# Patient Record
Sex: Female | Born: 1935 | Race: Black or African American | Hispanic: No | Marital: Single | Smoking: Never smoker
Health system: Southern US, Community
[De-identification: ages and names within clinical notes are randomized; demographics above are authoritative.]

## PROBLEM LIST (undated history)

## (undated) DIAGNOSIS — E785 Hyperlipidemia, unspecified: Secondary | ICD-10-CM

## (undated) DIAGNOSIS — I82409 Acute embolism and thrombosis of unspecified deep veins of unspecified lower extremity: Secondary | ICD-10-CM

## (undated) DIAGNOSIS — C189 Malignant neoplasm of colon, unspecified: Secondary | ICD-10-CM

## (undated) DIAGNOSIS — I10 Essential (primary) hypertension: Secondary | ICD-10-CM

## (undated) HISTORY — PX: APPENDECTOMY: SHX54

## (undated) HISTORY — DX: Acute embolism and thrombosis of unspecified deep veins of unspecified lower extremity: I82.409

## (undated) HISTORY — PX: COLON SURGERY: SHX602

## (undated) HISTORY — PX: ABDOMINAL HYSTERECTOMY: SHX81

---

## 1976-01-12 DIAGNOSIS — I82409 Acute embolism and thrombosis of unspecified deep veins of unspecified lower extremity: Secondary | ICD-10-CM

## 1976-01-12 HISTORY — DX: Acute embolism and thrombosis of unspecified deep veins of unspecified lower extremity: I82.409

## 2013-09-14 DIAGNOSIS — C189 Malignant neoplasm of colon, unspecified: Secondary | ICD-10-CM | POA: Insufficient documentation

## 2013-09-14 DIAGNOSIS — I10 Essential (primary) hypertension: Secondary | ICD-10-CM | POA: Insufficient documentation

## 2013-10-02 DIAGNOSIS — E785 Hyperlipidemia, unspecified: Secondary | ICD-10-CM | POA: Insufficient documentation

## 2013-11-28 ENCOUNTER — Emergency Department (INDEPENDENT_AMBULATORY_CARE_PROVIDER_SITE_OTHER)
Admission: EM | Admit: 2013-11-28 | Discharge: 2013-11-28 | Disposition: A | Discharge status: Home / Self Care | Payer: Medicare Other | Source: Home / Self Care | Attending: Family Medicine | Admitting: Family Medicine

## 2013-11-28 ENCOUNTER — Encounter: Payer: Self-pay | Admitting: *Deleted

## 2013-11-28 DIAGNOSIS — K0500 Acute gingivitis, plaque induced: Secondary | ICD-10-CM

## 2013-11-28 HISTORY — DX: Essential (primary) hypertension: I10

## 2013-11-28 HISTORY — DX: Malignant neoplasm of colon, unspecified: C18.9

## 2013-11-28 HISTORY — DX: Hyperlipidemia, unspecified: E78.5

## 2013-11-28 MED ORDER — PENICILLIN V POTASSIUM 500 MG PO TABS: 500.0000 mg | ORAL_TABLET | Freq: Four times a day (QID) | ORAL | Status: DC

## 2013-11-28 NOTE — ED Provider Notes (Signed)
CSN: [id number]     Arrival date & time 11/28/13  1042 History   First MD Initiated Contact with Patient 11/28/13 1148     Chief Complaint  Patient presents with  . Dental Pain     HPI Comments: Patient complains of soreness in her left upper gums for 3 days, and is concerned about a possible abscess.  She has had chills and possible low grade fever.  Patient is a 78 y.o. female presenting with tooth pain. The history is provided by the patient.  Dental Pain Location:  Upper Upper teeth location:  12/LU 1st bicuspid and 13/LU 2nd bicuspid Quality:  Aching and dull Severity:  Mild Onset quality:  Gradual Duration:  3 days Timing:  Constant Progression:  Worsening Chronicity:  New Context: abscess and poor dentition   Relieved by:  Nothing Worsened by:  Touching and pressure Ineffective treatments:  None tried Associated symptoms: fever and gum swelling   Associated symptoms: no congestion, no difficulty swallowing, no drooling, no facial pain, no facial swelling, no headaches, no neck pain, no neck swelling, no oral bleeding, no oral lesions and no trismus   Risk factors: periodontal disease     Past Medical History  Diagnosis Date  . Hypertension   . Hyperlipidemia   . Colon cancer    Past Surgical History  Procedure Laterality Date  . Abdominal hysterectomy    . Colon surgery    . Appendectomy     Family History  Problem Relation Age of Onset  . Uterine cancer Mother   . Stroke Father    History  Substance Use Topics  . Smoking status: Never Smoker   . Smokeless tobacco: Not on file  . Alcohol Use: Yes   OB History    No data available     Review of Systems  Constitutional: Positive for fever.  HENT: Negative for congestion, drooling, facial swelling and mouth sores.   Musculoskeletal: Negative for neck pain.  Neurological: Negative for headaches.  All other systems reviewed and are negative.   Allergies  Influenza vaccines  Home Medications    Prior to Admission medications   Medication Sig Start Date End Date Taking? Authorizing Provider  aspirin 81 MG tablet Take 81 mg by mouth daily.   Yes Historical Provider, MD  hydrochlorothiazide (HYDRODIURIL) 25 MG tablet Take 25 mg by mouth daily.   Yes Historical Provider, MD  lisinopril (PRINIVIL,ZESTRIL) 5 MG tablet Take 5 mg by mouth daily.   Yes Historical Provider, MD  penicillin v potassium (VEETID) 500 MG tablet Take 1 tablet (500 mg total) by mouth 4 (four) times daily. 11/28/13   Kandra Nicolas, MD   BP 151/90 mmHg  Pulse 103  Temp(Src) 100 F (37.8 C) (Oral)  Resp 16  Ht 5' 3.5" (1.613 m)  Wt 140 lb (63.504 kg)  BMI 24.41 kg/m2  SpO2 96% Physical Exam  Constitutional: She is oriented to person, place, and time. She appears well-developed and well-nourished. No distress.  HENT:  Head: Normocephalic and atraumatic.  Nose: Nose normal.  Mouth/Throat: No trismus in the jaw. Abnormal dentition. Dental caries present. No uvula swelling.    Left upper teeth as noted are tender to palpation.  Adjacent gingiva are tender and swollen but not fluctuant.   Eyes: Conjunctivae are normal. Pupils are equal, round, and reactive to light.  Neck: Neck supple.  Lymphadenopathy:    She has no cervical adenopathy.  Neurological: She is alert and oriented to person, place, and  time.  Skin: Skin is warm.  Nursing note and vitals reviewed.   ED Course  Procedures  none     MDM   1. Gingivitis, acute    Begin Penicillin VK 500mg  Q6hr. Continue warm salt water mouth rinse and warm pack to left cheek several times daily.  May take Tylenol for pain. Followup with dentist as soon as possible.    If symptoms become significantly worse during the night or over the weekend, proceed to the local emergency room.     Kandra Nicolas, MD 11/30/13 1440

## 2013-11-28 NOTE — Discharge Instructions (Signed)
Continue warm salt water mouth rinse and warm pack to left cheek several times daily.  May take Tylenol for pain.   If symptoms become significantly worse during the night or over the weekend, proceed to the local emergency room.    Gingivitis Gingivitis is a form of gum (periodontal) disease that causes redness, soreness, and swelling (inflammation) of your gums. CAUSES The most common cause of gingivitis is poor oral hygiene. A sticky substance made of bacteria, mucus, and food particles (plaque), is deposited on the exposed part of teeth. As plaque builds up, it reacts with the saliva in your mouth to form something called  tartar. Tartar is a hard deposit that becomes trapped around the base of the tooth. Plaque and tartar irritate the gums, leading to the formation of gingivitis. Other factors that increase your risk for gingivitis include:   Tobacco use.  Diabetes.  Older age.  Certain medications.  Certain viral or fungal infections.  Dry mouth.  Hormonal changes such as during pregnancy.  Poor nutrition.  Substance abuse.  Poor fitting dental restorations or appliances. SYMPTOMS You may notice inflammation of the soft tissue (gingiva) around the teeth. When these tissues become inflamed, they bleed easily, especially during flossing or brushing. The gums may also be:   Tender to the touch.  Bright red, purple red, or have a shiny appearance.  Swollen.  Wearing away from the teeth (receding), which exposes more of the tooth. Bad breath is often present. Continued infection around teeth can eventually cause cavities and loosen teeth. This may lead to eventual tooth loss. DIAGNOSIS A medical and dental history will be taken. Your mouth, teeth, and gums will be examined. Your dentist will look for soft, swollen purple-red, irritated gums. There may be deposits of plaque and tartar at the base of the teeth. Your gums will be looked at for the degree of redness, puffiness,  and bleeding tendencies. Your dentist will see if any of the teeth are loose. X-rays may be taken to see if the inflammation has spread to the supporting structures of the teeth. TREATMENT The goal is to reduce and reverse the inflammation. Proper treatment can usually reverse the symptoms of gingivitis and prevent further progression of the disease. Have your teeth cleaned. During the cleaning, all plaque and tartar will be removed. Instruction for proper home care will be given. You will need regular professional cleanings and check-ups in the future. HOME CARE INSTRUCTIONS  Brush your teeth twice a day and floss at least once per day. When flossing, it is best to floss first then brush.  Limit sugar between meals and maintain a well-balanced diet.  Even the best dental hygiene will not prevent plaque from developing. It is necessary for you to see your dentist on a regular basis for cleaning and regular checkups.  Your dentist can recommend proper oral hygiene and mouth care and suggest special toothpastes or mouth rinses.  Stop smoking. SEEK DENTAL OR MEDICAL CARE IF:  You have painful, reddened tissue around your teeth, or you have puffy swollen gums.  You have difficulty chewing.  You notice any loose or infected teeth.  You have swollen glands.  Your gums bleed easily when you brush your teeth or are very tender to the touch. Document Released: 06/23/2000 Document Revised: 03/22/2011 Document Reviewed: 04/03/2010 [facility] Patient Information 2015 [place], [place]. This information is not intended to replace advice given to you by your health care provider. Make sure you discuss any questions you have  with your health care provider. ° °

## 2013-11-28 NOTE — ED Notes (Signed)
Pt c/o abscess on her LT side upper gums x 3 days, with low grade fever. She does report having a URI currently. No OTC meds today.

## 2016-10-12 DIAGNOSIS — Z7982 Long term (current) use of aspirin: Secondary | ICD-10-CM | POA: Diagnosis not present

## 2016-10-12 DIAGNOSIS — N390 Urinary tract infection, site not specified: Secondary | ICD-10-CM | POA: Diagnosis not present

## 2016-10-12 DIAGNOSIS — R42 Dizziness and giddiness: Secondary | ICD-10-CM | POA: Diagnosis not present

## 2016-10-12 DIAGNOSIS — C189 Malignant neoplasm of colon, unspecified: Secondary | ICD-10-CM | POA: Diagnosis not present

## 2016-10-12 DIAGNOSIS — Z791 Long term (current) use of non-steroidal anti-inflammatories (NSAID): Secondary | ICD-10-CM | POA: Diagnosis not present

## 2016-10-12 DIAGNOSIS — N3000 Acute cystitis without hematuria: Secondary | ICD-10-CM | POA: Diagnosis not present

## 2016-10-12 DIAGNOSIS — K529 Noninfective gastroenteritis and colitis, unspecified: Secondary | ICD-10-CM | POA: Diagnosis not present

## 2016-10-12 DIAGNOSIS — I517 Cardiomegaly: Secondary | ICD-10-CM | POA: Diagnosis not present

## 2016-10-12 DIAGNOSIS — R197 Diarrhea, unspecified: Secondary | ICD-10-CM | POA: Diagnosis not present

## 2016-10-12 DIAGNOSIS — R112 Nausea with vomiting, unspecified: Secondary | ICD-10-CM | POA: Diagnosis not present

## 2016-10-12 DIAGNOSIS — Z9049 Acquired absence of other specified parts of digestive tract: Secondary | ICD-10-CM | POA: Diagnosis not present

## 2016-12-14 ENCOUNTER — Emergency Department (INDEPENDENT_AMBULATORY_CARE_PROVIDER_SITE_OTHER)
Admission: EM | Admit: 2016-12-14 | Discharge: 2016-12-14 | Disposition: A | Discharge status: Home / Self Care | Payer: Medicare Other | Source: Home / Self Care | Attending: Family Medicine | Admitting: Family Medicine

## 2016-12-14 ENCOUNTER — Other Ambulatory Visit: Payer: Self-pay

## 2016-12-14 DIAGNOSIS — I10 Essential (primary) hypertension: Secondary | ICD-10-CM | POA: Diagnosis not present

## 2016-12-14 LAB — POCT URINALYSIS DIP (MANUAL ENTRY)
BILIRUBIN UA: NEGATIVE mg/dL
Bilirubin, UA: NEGATIVE
GLUCOSE UA: NEGATIVE mg/dL
Leukocytes, UA: NEGATIVE
Nitrite, UA: NEGATIVE
Protein Ur, POC: NEGATIVE mg/dL
RBC UA: NEGATIVE
SPEC GRAV UA: 1.02 (ref 1.010–1.025)
Urobilinogen, UA: 0.2 E.U./dL
pH, UA: 7 (ref 5.0–8.0)

## 2016-12-14 MED ORDER — HYDROCHLOROTHIAZIDE 25 MG PO TABS: 25.0000 mg | ORAL_TABLET | Freq: Every day | ORAL | 1 refills | Status: DC

## 2016-12-14 NOTE — ED Triage Notes (Signed)
Pt was at the dentist today, and BP was high.  Here to have it checked.

## 2016-12-14 NOTE — ED Provider Notes (Signed)
[name]    CSN: [id number] Arrival date & time: 12/14/16  1841     History   Chief Complaint Chief Complaint  Patient presents with  . Hypertension    HPI Selena Mccarthy is a 81 y.o. female.   Patient underwent dental procedure today, and her BP was noted to be quite elevated.  Patient feels well and denies headache, chest pain, shortness of breath, and history of TIA symptoms.  She states that she has been treated in the past for hypertension with an excellent response from HCTZ.   The history is provided by the patient.    Past Medical History:  Diagnosis Date  . Colon cancer ([place])   . Hyperlipidemia   . Hypertension     There are no active problems to display for this patient.   Past Surgical History:  Procedure Laterality Date  . ABDOMINAL HYSTERECTOMY    . APPENDECTOMY    . COLON SURGERY      OB History    No data available       Home Medications    Prior to Admission medications   Medication Sig Start Date End Date Taking? Authorizing Provider  aspirin 81 MG tablet Take 81 mg by mouth daily.    [provider]  hydrochlorothiazide (HYDRODIURIL) 25 MG tablet Take 1 tablet (25 mg total) by mouth daily. 12/14/16   Kandra Nicolas, MD  lisinopril (PRINIVIL,ZESTRIL) 5 MG tablet Take 5 mg by mouth daily.    [provider]  penicillin v potassium (VEETID) 500 MG tablet Take 1 tablet (500 mg total) by mouth 4 (four) times daily. 11/28/13   Kandra Nicolas, MD    Family History Family History  Problem Relation Age of Onset  . Uterine cancer Mother   . Stroke Father     Social History Social History   Tobacco Use  . Smoking status: Never Smoker  Substance Use Topics  . Alcohol use: Yes  . Drug use: No     Allergies   Influenza vaccines   Review of Systems Review of Systems  Constitutional: Negative for activity change, appetite change, chills, diaphoresis, fatigue, fever and unexpected weight change.    HENT: Negative.   Eyes: Negative for photophobia and visual disturbance.  Respiratory: Negative for cough, chest tightness, shortness of breath, wheezing and stridor.   Cardiovascular: Negative for chest pain, palpitations and leg swelling.  Gastrointestinal: Negative.   Genitourinary: Negative.   Musculoskeletal: Negative.   Skin: Negative.   Neurological: Negative for dizziness, tremors, syncope, facial asymmetry, speech difficulty, weakness, light-headedness, numbness and headaches.     Physical Exam Triage Vital Signs ED Triage Vitals  Enc Vitals Group     BP 12/14/16 1901 (!) 218/125     Pulse Rate 12/14/16 1901 85     Resp --      Temp 12/14/16 1901 97.9 F (36.6 C)     Temp Source 12/14/16 1901 Oral     SpO2 12/14/16 1901 98 %     Weight 12/14/16 1902 128 lb (58.1 kg)     Height 12/14/16 1902 5\' 3"  (1.6 m)     Head Circumference --      Peak Flow --      Pain Score 12/14/16 1902 0     Pain Loc --      Pain Edu? --      Excl. in [place]? --    No data found.  Updated Vital Signs BP Marland Kitchen)  200/133 (BP Location: Left Arm)   Pulse 85   Temp 97.9 F (36.6 C) (Oral)   Ht 5\' 3"  (1.6 m)   Wt 128 lb (58.1 kg)   SpO2 98%   BMI 22.67 kg/m   Visual Acuity Right Eye Distance:   Left Eye Distance:   Bilateral Distance:    Right Eye Near:   Left Eye Near:    Bilateral Near:     Physical Exam Nursing notes and Vital Signs reviewed. Appearance:  Patient appears stated age, and in no acute distress.  She is alert and oriented.  Eyes:  Pupils are equal, round, and reactive to light and accomodation.  Extraocular movement is intact.  Conjunctivae are not inflamed.  Fundi benign Ears:  Canals normal.  Tympanic membranes normal.  Nose:   Normal turbinates.  No sinus tenderness.   Pharynx:  Normal Neck:  Supple.  No adenopathy or thyromegaly.  Carotids have normal upstrokes without bruits.  Lungs:  Clear to auscultation.  Breath sounds are equal.  Moving air well. Heart:   Regular rate and rhythm without murmurs, rubs, or gallops.  Abdomen:  Nontender without masses or hepatosplenomegaly.  Bowel sounds are present.  No CVA or flank tenderness.  Extremities:  No edema. Pedal pulses intact Skin:  No rash present.    UC Treatments / Results  Labs (all labs ordered are listed, but only abnormal results are displayed) Labs Reviewed  COMPLETE METABOLIC PANEL WITH GFR  POCT CBC W AUTO DIFF ([facility])  POCT URINALYSIS DIP (MANUAL ENTRY) negative    EKG  EKG Interpretation None       Radiology No results found.  Procedures Procedures (including critical care time)  Medications Ordered in UC Medications - No data to display   Initial Impression / Assessment and Plan / UC Course  I have reviewed the triage vital signs and the nursing notes.  Pertinent labs & imaging results that were available during my care of the patient were reviewed by me and considered in my medical decision making (see chart for details).    Resume HCTZ 25mg  daily. CMP, CBC pending. Minimize salt intake. Monitor blood pressure more frequently at different times of day and record on a calendar.  If symptoms become significantly worse during the night or over the weekend, proceed to the local emergency room.  Followup with Family Doctor in 3 days.    Final Clinical Impressions(s) / UC Diagnoses   Final diagnoses:  Essential hypertension    ED Discharge Orders        Ordered    hydrochlorothiazide (HYDRODIURIL) 25 MG tablet  Daily     12/14/16 1933          Kandra Nicolas, MD 12/15/16 2056

## 2016-12-14 NOTE — Discharge Instructions (Signed)
Minimize salt intake. Monitor blood pressure more frequently at different times of day and record on a calendar.  If symptoms become significantly worse during the night or over the weekend, proceed to the local emergency room.

## 2016-12-14 NOTE — ED Notes (Signed)
Attempted to draw blood from both antecubital areas without success. Pt will hydrate and have her blood draw at her appt with her PCP on Friday. Charna Archer, LPN

## 2016-12-17 ENCOUNTER — Emergency Department
Admission: EM | Admit: 2016-12-17 | Discharge: 2016-12-17 | Disposition: A | Discharge status: Home / Self Care | Payer: Medicare Other | Source: Home / Self Care

## 2016-12-17 ENCOUNTER — Telehealth: Payer: Self-pay | Admitting: *Deleted

## 2016-12-17 DIAGNOSIS — I10 Essential (primary) hypertension: Secondary | ICD-10-CM

## 2016-12-17 LAB — CBC WITH DIFFERENTIAL/PLATELET
Basophils Absolute: 21 cells/uL (ref 0–200)
Basophils Relative: 0.4 %
EOS PCT: 0.8 %
Eosinophils Absolute: 42 cells/uL (ref 15–500)
HCT: 41.5 % (ref 35.0–45.0)
HEMOGLOBIN: 14.6 g/dL (ref 11.7–15.5)
Lymphs Abs: 2070 cells/uL (ref 850–3900)
MCH: 30.6 pg (ref 27.0–33.0)
MCHC: 35.2 g/dL (ref 32.0–36.0)
MCV: 87 fL (ref 80.0–100.0)
MONOS PCT: 8.7 %
MPV: 10.1 fL (ref 7.5–12.5)
NEUTROS ABS: 2616 {cells}/uL (ref 1500–7800)
Neutrophils Relative %: 50.3 %
PLATELETS: 250 10*3/uL (ref 140–400)
RBC: 4.77 10*6/uL (ref 3.80–5.10)
RDW: 13.6 % (ref 11.0–15.0)
TOTAL LYMPHOCYTE: 39.8 %
WBC mixed population: 452 cells/uL (ref 200–950)
WBC: 5.2 10*3/uL (ref 3.8–10.8)

## 2016-12-17 LAB — COMPLETE METABOLIC PANEL WITH GFR
AG Ratio: 1.5 (calc) (ref 1.0–2.5)
ALT: 7 U/L (ref 6–29)
AST: 14 U/L (ref 10–35)
Albumin: 4.6 g/dL (ref 3.6–5.1)
Alkaline phosphatase (APISO): 76 U/L (ref 33–130)
BUN: 16 mg/dL (ref 7–25)
CHLORIDE: 101 mmol/L (ref 98–110)
CO2: 32 mmol/L (ref 20–32)
Calcium: 10.2 mg/dL (ref 8.6–10.4)
Creat: 0.8 mg/dL (ref 0.60–0.93)
GFR, Est African American: 81 mL/min/{1.73_m2} (ref >=60)
GFR, Est Non African American: 70 mL/min/{1.73_m2} (ref >=60)
Globulin: 3.1 g/dL (calc) (ref 1.9–3.7)
Glucose, Bld: 85 mg/dL (ref 65–99)
Potassium: 3.7 mmol/L (ref 3.5–5.3)
Sodium: 137 mmol/L (ref 135–146)
Total Bilirubin: 0.6 mg/dL (ref 0.2–1.2)
Total Protein: 7.7 g/dL (ref 6.1–8.1)

## 2016-12-17 NOTE — ED Triage Notes (Addendum)
Patient is here for BP check, orders for lab and a PCP appointment. Apt scheduled with [name] to see Dr. Sheppard Coil 12/22/16 for a 1:20 arrival. Orders for CBC and CMP placed per Dr. Assunta Found.  She is asymptomatic today. BP is still elevated. She has taken 3 doses of HCTZ.

## 2016-12-17 NOTE — Telephone Encounter (Signed)
Notified patient of lab results.  Has a follow up with PC next week.

## 2016-12-17 NOTE — ED Notes (Signed)
Patient will go downstairs to [facility] for a blood draw due to difficulty from previous visit.

## 2016-12-22 ENCOUNTER — Encounter: Payer: Self-pay | Admitting: Physician Assistant

## 2016-12-22 ENCOUNTER — Ambulatory Visit (INDEPENDENT_AMBULATORY_CARE_PROVIDER_SITE_OTHER): Payer: Medicare Other | Admitting: Physician Assistant

## 2016-12-22 VITALS — BP 165/96 | HR 84 | Wt 129.5 lb

## 2016-12-22 DIAGNOSIS — R011 Cardiac murmur, unspecified: Secondary | ICD-10-CM | POA: Diagnosis not present

## 2016-12-22 DIAGNOSIS — Z823 Family history of stroke: Secondary | ICD-10-CM

## 2016-12-22 DIAGNOSIS — Z86718 Personal history of other venous thrombosis and embolism: Secondary | ICD-10-CM

## 2016-12-22 DIAGNOSIS — Z85038 Personal history of other malignant neoplasm of large intestine: Secondary | ICD-10-CM

## 2016-12-22 DIAGNOSIS — Z7689 Persons encountering health services in other specified circumstances: Secondary | ICD-10-CM | POA: Diagnosis not present

## 2016-12-22 DIAGNOSIS — I10 Essential (primary) hypertension: Secondary | ICD-10-CM | POA: Diagnosis not present

## 2016-12-22 MED ORDER — LISINOPRIL 20 MG PO TABS: 20.0000 mg | ORAL_TABLET | Freq: Every day | ORAL | 3 refills | Status: DC

## 2016-12-22 NOTE — Progress Notes (Signed)
HPI:                                                                Selena Mccarthy is a 81 y.o. female who presents to [facility]: [facility] today to establish care  Current concerns include: blood pressure  HTN: taking Hydrochlorothiazide daily. Compliant with medications. Does not monitor BP at home. Denies vision change, headache, chest pain with exertion, orthopnea, lightheadedness, syncope and edema. Risk factors include: HLD, age>55   Past Medical History:  Diagnosis Date  . Colon cancer ([place])   . DVT, lower extremity ([place]) 1978   Left  . Hyperlipidemia   . Hypertension    Past Surgical History:  Procedure Laterality Date  . ABDOMINAL HYSTERECTOMY    . APPENDECTOMY    . COLON SURGERY     Social History   Tobacco Use  . Smoking status: Never Smoker  . Smokeless tobacco: Never Used  Substance Use Topics  . Alcohol use: Yes   family history includes Stroke in her father; Uterine cancer in her mother.  ROS: negative except as noted in the HPI  Medications: Current Outpatient Medications  Medication Sig Dispense Refill  . aspirin 81 MG tablet Take 81 mg by mouth daily.    . hydrochlorothiazide (HYDRODIURIL) 25 MG tablet Take 1 tablet (25 mg total) by mouth daily. 30 tablet 1  . lisinopril (PRINIVIL,ZESTRIL) 20 MG tablet Take 1 tablet (20 mg total) by mouth daily. 30 tablet 3   No current facility-administered medications for this visit.    Allergies  Allergen Reactions  . Influenza Vaccines     Objective:  BP (!) 165/96   Pulse 84   Wt 129 lb 8 oz (58.7 kg)   SpO2 98%   BMI 22.94 kg/m  Gen:  alert, not ill-appearing, no distress, appropriate for age 50: head normocephalic without obvious abnormality, conjunctiva and cornea clear, trachea midline Pulm: Normal work of breathing, normal phonation, clear to auscultation bilaterally, no wheezes, rales or rhonchi CV: Normal rate, regular rhythm, s1 and s2 distinct, grade  III/VI systolic murmur heard best at left lower sternal border, no clicks or rubs; no carotid bruit Neuro: alert and oriented x 3, no tremor MSK: extremities atraumatic, normal gait and station, trace peripheral edema Skin: intact, no rashes on exposed skin, no jaundice, no cyanosis Psych: well-groomed, cooperative, good eye contact, euthymic mood, affect mood-congruent, speech is articulate, and thought processes clear and goal-directed  Depression screen [facility] 2/9 12/22/2016  Decreased Interest 0  Down, Depressed, Hopeless 0  PHQ - 2 Score 0     No results found for this or any previous visit (from the past 72 hour(s)). No results found.    Assessment and Plan: 81 y.o. female with   1. Encounter to establish care - reviewed PMH, PSH, PFH, medications and allergies - reviewed health maintenance - requesting records from [place]   2. Uncontrolled stage 2 hypertension BP Readings from Last 3 Encounters:  12/22/16 (!) 165/96  12/17/16 (!) 189/123  12/14/16 (!) 200/133  - severe hypertension, asymptomatic. Recheck BP was slightly improved - adding ACE to thiazide - therapeutic lifestyle changes - patient unable to monitor BP at home - recheck nurse visit in 2 weeks -  normal CBC and CMP 5 days ago - lisinopril (PRINIVIL,ZESTRIL) 20 MG tablet; Take 1 tablet (20 mg total) by mouth daily.  Dispense: 30 tablet; Refill: 3  3. Systolic murmur - patient reports history of murmur, she is not sure if she has ever had an echocardiogram. Requesting records. If no prior echo, will order to assess for valvular disease  4. Family history of stroke or transient ischemic attack in father   3. History of colon cancer  Patient education and anticipatory guidance given Patient agrees with treatment plan Follow-up in 2 weeks for hypertension or sooner as needed if symptoms worsen or fail to improve  Darlyne Russian PA-C

## 2016-12-22 NOTE — Patient Instructions (Addendum)
For your blood pressure: - Continue your Hydrochlorothiazide every morning. Additionally take Lisinopril 1 tab ever morning with your other medicine - Limit salt to <1500 mg/day - Follow DASH eating plan - limit alcohol to 1 standard drink per day - avoid tobacco products - Follow-up in 2 weeks    Preventing Cerebrovascular Disease Arteries are blood vessels that carry blood that contains oxygen from the heart to all parts of the body. Cerebrovascular disease affects arteries that supply the brain. Any condition that blocks or disrupts blood flow to the brain can cause cerebrovascular disease. Brain cells that lose blood supply start to die within minutes (stroke). Stroke is the main danger of cerebrovascular disease. Atherosclerosis and high blood pressure are common causes of cerebrovascular disease. Atherosclerosis is narrowing and hardening of an artery that results when fat, cholesterol, calcium, or other substances (plaque) build up inside an artery. Plaque reduces blood flow through the artery. High blood pressure increases the risk of bleeding inside the brain. Making diet and lifestyle changes to prevent atherosclerosis and high blood pressure lowers your risk of cerebrovascular disease. What nutrition changes can be made?  Eat more fruits, vegetables, and whole grains.  Reduce how much saturated fat you eat. To do this, eat less red meat and fewer full-fat dairy products.  Eat healthy proteins instead of red meat. Healthy proteins include: ? Fish. Eat fish that contains heart-healthy omega-3 fatty acids, twice a week. Examples include salmon, albacore tuna, mackerel, and herring. ? Chicken. ? Nuts. ? Low-fat or nonfat yogurt.  Avoid processed meats, like bacon and lunchmeat.  Avoid foods that contain: ? A lot of sugar, such as sweets and drinks with added sugar. ? A lot of salt (sodium). Avoid adding extra salt to your food, as told by your health care provider. ? Trans fats,  such as margarine and baked goods. Trans fats may be listed as "partially hydrogenated oils" on food labels.  Check food labels to see how much sodium, sugar, and trans fats are in foods.  Use vegetable oils that contain low amounts of saturated fat, such as olive oil or canola oil. What lifestyle changes can be made?  Drink alcohol in moderation. This means no more than 1 drink a day for nonpregnant women and 2 drinks a day for men. One drink equals 12 oz of beer, 5 oz of wine, or 1 oz of hard liquor.  If you are overweight, ask your health care provider to recommend a weight-loss plan for you. Losing 5-10 lb (2.2-4.5 kg) can reduce your risk of diabetes, atherosclerosis, and high blood pressure.  Exercise for 30?60 minutes on most days, or as much as told by your health care provider. ? Do moderate-intensity exercise, such as brisk walking, bicycling, and water aerobics. Ask your health care provider which activities are safe for you.  Do not use any products that contain nicotine or tobacco, such as cigarettes and e-cigarettes. If you need help quitting, ask your health care provider. Why are these changes important? Making these changes lowers your risk of many diseases that can cause cerebrovascular disease and stroke. Stroke is a leading cause of death and disability. Making these changes also improves your overall health and quality of life. What can I do to lower my risk? The following factors make you more likely to develop cerebrovascular disease:  Being overweight.  Smoking.  Being physically inactive.  Eating a high-fat diet.  Having certain health conditions, such as: ? Diabetes. ? High blood pressure. ?  Heart disease. ? Atherosclerosis. ? High cholesterol. ? Sickle cell disease.  Talk with your health care provider about your risk for cerebrovascular disease. Work with your health care provider to control diseases that you have that may contribute to cerebrovascular  disease. Your health care provider may prescribe medicines to help prevent major causes of cerebrovascular disease. Where to find more information: Learn more about preventing cerebrovascular disease from:  [place], Lung, and [place]: MoAnalyst.de  Centers for Disease Control and Prevention: http://www.curry-wood.biz/  Summary  Cerebrovascular disease can lead to a stroke.  Atherosclerosis and high blood pressure are major causes of cerebrovascular disease.  Making diet and lifestyle changes can reduce your risk of cerebrovascular disease.  Work with your health care provider to get your risk factors under control to reduce your risk of cerebrovascular disease. This information is not intended to replace advice given to you by your health care provider. Make sure you discuss any questions you have with your health care provider. Document Released: 01/12/2015 Document Revised: 07/18/2015 Document Reviewed: 01/12/2015 Elsevier Interactive Patient Education  2018 [employer].

## 2017-01-06 ENCOUNTER — Encounter: Payer: Self-pay | Admitting: Physician Assistant

## 2017-01-06 ENCOUNTER — Ambulatory Visit (INDEPENDENT_AMBULATORY_CARE_PROVIDER_SITE_OTHER): Payer: Medicare Other | Admitting: Physician Assistant

## 2017-01-06 VITALS — BP 152/84 | HR 73 | Temp 98.6°F

## 2017-01-06 DIAGNOSIS — I10 Essential (primary) hypertension: Secondary | ICD-10-CM | POA: Insufficient documentation

## 2017-01-06 NOTE — Progress Notes (Signed)
Pt will come back in two weeks to have her blood pressure checked. Pt stated her BP was elevated this morning due to forgetting about her appointment and rushing to get here.

## 2017-01-07 ENCOUNTER — Telehealth: Payer: Self-pay

## 2017-01-07 NOTE — Telephone Encounter (Signed)
-----   Message from [place], [place] sent at 01/06/2017 10:56 AM EST ----- Please ask patient to monitor her BP at home with an automated cuff or to at least stop by local pharmacy a few times and log these readings and bring with her to her next nurse visit.

## 2017-01-07 NOTE — Telephone Encounter (Signed)
Clld pt - left message on vm to contact office re BP.

## 2017-01-20 ENCOUNTER — Ambulatory Visit: Payer: Medicare Other

## 2017-03-01 ENCOUNTER — Telehealth: Payer: Self-pay | Admitting: Physician Assistant

## 2017-03-01 ENCOUNTER — Other Ambulatory Visit: Payer: Self-pay

## 2017-03-01 MED ORDER — HYDROCHLOROTHIAZIDE 25 MG PO TABS: 25.0000 mg | ORAL_TABLET | Freq: Every day | ORAL | 0 refills | Status: DC

## 2017-03-01 NOTE — Telephone Encounter (Signed)
Refill sent -EH/RMA  

## 2017-03-01 NOTE — Telephone Encounter (Signed)
Pt is requesting a refill of hydrochlorothiazide be sent to the [facility]. Please advise. Thanks!

## 2017-05-04 ENCOUNTER — Ambulatory Visit (INDEPENDENT_AMBULATORY_CARE_PROVIDER_SITE_OTHER): Payer: Medicare Other | Admitting: Physician Assistant

## 2017-05-04 ENCOUNTER — Encounter: Payer: Self-pay | Admitting: Physician Assistant

## 2017-05-04 VITALS — BP 157/93 | HR 69 | Wt 130.0 lb

## 2017-05-04 DIAGNOSIS — Z23 Encounter for immunization: Secondary | ICD-10-CM | POA: Diagnosis not present

## 2017-05-04 DIAGNOSIS — M7701 Medial epicondylitis, right elbow: Secondary | ICD-10-CM

## 2017-05-04 DIAGNOSIS — I10 Essential (primary) hypertension: Secondary | ICD-10-CM

## 2017-05-04 MED ORDER — HYDROCHLOROTHIAZIDE 25 MG PO TABS: 25.0000 mg | ORAL_TABLET | Freq: Every day | ORAL | 1 refills | Status: DC

## 2017-05-04 MED ORDER — LISINOPRIL 20 MG PO TABS: 20.0000 mg | ORAL_TABLET | Freq: Every day | ORAL | 1 refills | Status: DC

## 2017-05-04 MED ORDER — LISINOPRIL 20 MG PO TABS: 20.0000 mg | ORAL_TABLET | Freq: Every day | ORAL | 3 refills | Status: DC

## 2017-05-04 MED ORDER — HYDROCHLOROTHIAZIDE 25 MG PO TABS: 25.0000 mg | ORAL_TABLET | Freq: Every day | ORAL | 0 refills | Status: DC

## 2017-05-04 NOTE — Progress Notes (Signed)
HPI:                                                                Selena Mccarthy is a 82 y.o. female who presents to [facility] today for hypertension follow-up   HTN: taking Lisinopril 20mg  and  Hydrochlorothiazide 25 mg daily. Compliant with medications. Monitor BP's at home, but can't remember her readings and does not have a log. She states they fluctuate. She is very physically active, gardening and taking care of granddaughter aged 48 years. Denies vision change, headache, chest pain with exertion, orthopnea, lightheadedness, syncope and edema. Risk factors include: HLD, age>65  Reports pain over the bony prominence of her right medial elbow for the last 3 months or so. Mainly notices it when she rests her elbow on an arm rest or bumps it. Denies joint swelling or limited range of motion. Reports she is right handed and does a lot of repetitive motions in her garden.   Depression screen [facility] 2/9 12/22/2016  Decreased Interest 0  Down, Depressed, Hopeless 0  PHQ - 2 Score 0    No flowsheet data found.    Past Medical History:  Diagnosis Date  . Colon cancer ([place])   . DVT, lower extremity ([place]) 1978   Left  . Hyperlipidemia   . Hypertension    Past Surgical History:  Procedure Laterality Date  . ABDOMINAL HYSTERECTOMY    . APPENDECTOMY    . COLON SURGERY     Social History   Tobacco Use  . Smoking status: Never Smoker  . Smokeless tobacco: Never Used  Substance Use Topics  . Alcohol use: Yes   family history includes Stroke in her father; Uterine cancer in her mother.    ROS: negative except as noted in the HPI  Medications: Current Outpatient Medications  Medication Sig Dispense Refill  . aspirin 81 MG tablet Take 81 mg by mouth daily.    . hydrochlorothiazide (HYDRODIURIL) 25 MG tablet Take 1 tablet (25 mg total) by mouth daily. Due for follow up visit 30 tablet 0  . lisinopril (PRINIVIL,ZESTRIL) 20 MG  tablet Take 1 tablet (20 mg total) by mouth daily. 30 tablet 3   No current facility-administered medications for this visit.    Allergies  Allergen Reactions  . Influenza Vaccines        Objective:  BP (!) 157/93   Pulse 69   Wt 130 lb (59 kg)   BMI 23.03 kg/m  Gen:  alert, not ill-appearing, no distress, appropriate for age 24: head normocephalic without obvious abnormality, conjunctiva and cornea clear, trachea midline Pulm: Normal work of breathing, normal phonation, clear to auscultation bilaterally, no wheezes, rales or rhonchi CV: Normal rate, regular rhythm, s1 and s2 distinct, no murmurs, clicks or rubs  Neuro: alert and oriented x 3, no tremor MSK: extremities atraumatic, normal gait and station, wearing left-sided knee high compression stocking, no peripheral edema on the right Right elbow: atraumatic, no edema or effusion, tenderness over the medial epicondyle, ROM intact, strength intact Skin: intact, no rashes on exposed skin, no jaundice, no cyanosis Psych: well-groomed, cooperative, good eye contact, euthymic mood, affect mood-congruent, speech is articulate, and thought processes clear and goal-directed  Lab Results  Component  Value Date   CREATININE 0.80 12/17/2016   BUN 16 12/17/2016   NA 137 12/17/2016   K 3.7 12/17/2016   CL 101 12/17/2016   CO2 32 12/17/2016     No results found for this or any previous visit (from the past 31 hour(s)). No results found.    Assessment and Plan: 82 y.o. female with   1. Essential hypertension BP Readings from Last 3 Encounters:  05/04/17 (!) 157/93  01/06/17 (!) 152/84  12/22/16 (!) 165/96  - nearly at goal on ACE/thiazide combo. Continue daily meds - renal function and lytes wnl - counseled on therapeutic lifestyle changes - cont to monitor BP at home - follow-up every 6 months   2. Medial epicondylitis of elbow, right - home rehab exercises - ice and NSAID prn  Essential hypertension - Plan:  lisinopril (PRINIVIL,ZESTRIL) 20 MG tablet, hydrochlorothiazide (HYDRODIURIL) 25 MG tablet, DISCONTINUED: hydrochlorothiazide (HYDRODIURIL) 25 MG tablet, DISCONTINUED: lisinopril (PRINIVIL,ZESTRIL) 20 MG tablet  Medial epicondylitis of elbow, right  Need for 23-polyvalent pneumococcal polysaccharide vaccine - Plan: Pneumococcal polysaccharide vaccine 23-valent greater than or equal to 2yo subcutaneous/IM   Patient education and anticipatory guidance given Patient agrees with treatment plan Follow-up in 3 months for Medicare Wellness or sooner as needed if symptoms worsen or fail to improve  Darlyne Russian PA-C

## 2017-05-04 NOTE — Patient Instructions (Addendum)
For your blood pressure: - Goal <140/90 - continue Lisinopril 20 mg and Hydrochlorothiazide 25 mg daily - monitor and log blood pressures at home - check around the same time each day in a relaxed setting - Limit salt to <2000 mg/day - Follow DASH eating plan - limit alcohol to 2 standard drinks per day for men and 1 per day for women - avoid tobacco products - weight loss: 7% of current body weight - follow-up every 6 months for your blood pressure     Managing Your Hypertension Hypertension is commonly called high blood pressure. This is when the force of your blood pressing against the walls of your arteries is too strong. Arteries are blood vessels that carry blood from your heart throughout your body. Hypertension forces the heart to work harder to pump blood, and may cause the arteries to become narrow or stiff. Having untreated or uncontrolled hypertension can cause heart attack, stroke, kidney disease, and other problems. What are blood pressure readings? A blood pressure reading consists of a higher number over a lower number. Ideally, your blood pressure should be below 120/80. The first ("top") number is called the systolic pressure. It is a measure of the pressure in your arteries as your heart beats. The second ("bottom") number is called the diastolic pressure. It is a measure of the pressure in your arteries as the heart relaxes. What does my blood pressure reading mean? Blood pressure is classified into four stages. Based on your blood pressure reading, your health care provider may use the following stages to determine what type of treatment you need, if any. Systolic pressure and diastolic pressure are measured in a unit called mm Hg. Normal  Systolic pressure: below 803.  Diastolic pressure: below 80. Elevated  Systolic pressure: 212-248.  Diastolic pressure: below 80. Hypertension stage 1  Systolic pressure: 250-037.  Diastolic pressure: 04-88. Hypertension  stage 2  Systolic pressure: 891 or above.  Diastolic pressure: 90 or above. What health risks are associated with hypertension? Managing your hypertension is an important responsibility. Uncontrolled hypertension can lead to:  A heart attack.  A stroke.  A weakened blood vessel (aneurysm).  Heart failure.  Kidney damage.  Eye damage.  Metabolic syndrome.  Memory and concentration problems.  What changes can I make to manage my hypertension? Hypertension can be managed by making lifestyle changes and possibly by taking medicines. Your health care provider will help you make a plan to bring your blood pressure within a normal range. Eating and drinking  Eat a diet that is high in fiber and potassium, and low in salt (sodium), added sugar, and fat. An example eating plan is called the DASH (Dietary Approaches to Stop Hypertension) diet. To eat this way: ? Eat plenty of fresh fruits and vegetables. Try to fill half of your plate at each meal with fruits and vegetables. ? Eat whole grains, such as whole wheat pasta, brown rice, or whole grain bread. Fill about one quarter of your plate with whole grains. ? Eat low-fat diary products. ? Avoid fatty cuts of meat, processed or cured meats, and poultry with skin. Fill about one quarter of your plate with lean proteins such as fish, chicken without skin, beans, eggs, and tofu. ? Avoid premade and processed foods. These tend to be higher in sodium, added sugar, and fat.  Reduce your daily sodium intake. Most people with hypertension should eat less than 1,500 mg of sodium a day.  Limit alcohol intake to no more  than 1 drink a day for nonpregnant women and 2 drinks a day for men. One drink equals 12 oz of beer, 5 oz of wine, or 1 oz of hard liquor. Lifestyle  Work with your health care provider to maintain a healthy body weight, or to lose weight. Ask what an ideal weight is for you.  Get at least 30 minutes of exercise that causes  your heart to beat faster (aerobic exercise) most days of the week. Activities may include walking, swimming, or biking.  Include exercise to strengthen your muscles (resistance exercise), such as weight lifting, as part of your weekly exercise routine. Try to do these types of exercises for 30 minutes at least 3 days a week.  Do not use any products that contain nicotine or tobacco, such as cigarettes and e-cigarettes. If you need help quitting, ask your health care provider.  Control any long-term (chronic) conditions you have, such as high cholesterol or diabetes. Monitoring  Monitor your blood pressure at home as told by your health care provider. Your personal target blood pressure may vary depending on your medical conditions, your age, and other factors.  Have your blood pressure checked regularly, as often as told by your health care provider. Working with your health care provider  Review all the medicines you take with your health care provider because there may be side effects or interactions.  Talk with your health care provider about your diet, exercise habits, and other lifestyle factors that may be contributing to hypertension.  Visit your health care provider regularly. Your health care provider can help you create and adjust your plan for managing hypertension. Will I need medicine to control my blood pressure? Your health care provider may prescribe medicine if lifestyle changes are not enough to get your blood pressure under control, and if:  Your systolic blood pressure is 130 or higher.  Your diastolic blood pressure is 80 or higher.  Take medicines only as told by your health care provider. Follow the directions carefully. Blood pressure medicines must be taken as prescribed. The medicine does not work as well when you skip doses. Skipping doses also puts you at risk for problems. Contact a health care provider if:  You think you are having a reaction to medicines  you have taken.  You have repeated (recurrent) headaches.  You feel dizzy.  You have swelling in your ankles.  You have trouble with your vision. Get help right away if:  You develop a severe headache or confusion.  You have unusual weakness or numbness, or you feel faint.  You have severe pain in your chest or abdomen.  You vomit repeatedly.  You have trouble breathing. Summary  Hypertension is when the force of blood pumping through your arteries is too strong. If this condition is not controlled, it may put you at risk for serious complications.  Your personal target blood pressure may vary depending on your medical conditions, your age, and other factors. For most people, a normal blood pressure is less than 120/80.  Hypertension is managed by lifestyle changes, medicines, or both. Lifestyle changes include weight loss, eating a healthy, low-sodium diet, exercising more, and limiting alcohol. This information is not intended to replace advice given to you by your health care provider. Make sure you discuss any questions you have with your health care provider. Document Released: 09/22/2011 Document Revised: 11/26/2015 Document Reviewed: 11/26/2015 Elsevier Interactive Patient Education  [employer].

## 2017-08-03 ENCOUNTER — Ambulatory Visit (INDEPENDENT_AMBULATORY_CARE_PROVIDER_SITE_OTHER): Payer: Medicare Other | Admitting: Physician Assistant

## 2017-08-03 ENCOUNTER — Encounter: Payer: Self-pay | Admitting: Physician Assistant

## 2017-08-03 VITALS — BP 198/108 | HR 76 | Wt 125.0 lb

## 2017-08-03 DIAGNOSIS — Z13220 Encounter for screening for lipoid disorders: Secondary | ICD-10-CM

## 2017-08-03 DIAGNOSIS — I10 Essential (primary) hypertension: Secondary | ICD-10-CM

## 2017-08-03 DIAGNOSIS — E876 Hypokalemia: Secondary | ICD-10-CM

## 2017-08-03 DIAGNOSIS — T502X5A Adverse effect of carbonic-anhydrase inhibitors, benzothiadiazides and other diuretics, initial encounter: Secondary | ICD-10-CM

## 2017-08-03 DIAGNOSIS — Z0000 Encounter for general adult medical examination without abnormal findings: Secondary | ICD-10-CM | POA: Diagnosis not present

## 2017-08-03 MED ORDER — AMLODIPINE BESYLATE 10 MG PO TABS: 10.0000 mg | ORAL_TABLET | Freq: Every day | ORAL | 3 refills | Status: DC

## 2017-08-03 NOTE — Progress Notes (Signed)
Subjective:   Selena Mccarthy is a 82 y.o. female who presents for Medicare Annual (Subsequent) preventive examination.  Review of Systems:  Review of Systems  HENT:       + R ear fullness  Eyes: Positive for blurred vision (wears corrective lenses).  Musculoskeletal: Positive for back pain (left-sided).  Skin: Positive for rash (insect bite, left knee).  All other systems reviewed and are negative.         Objective:     Vitals: BP (!) 198/108   Pulse 76   Wt 125 lb (56.7 kg)   BMI 22.14 kg/m   Body mass index is 22.14 kg/m. Gen:  alert, not ill-appearing, no distress, appropriate for age 67: head normocephalic without obvious abnormality, conjunctiva and cornea clear, trachea midline, no carotid bruit Pulm: Normal work of breathing, normal phonation, clear to auscultation bilaterally, no wheezes, rales or rhonchi CV: Normal rate, regular rhythm, s1 and s2 distinct, no murmurs, clicks or rubs  Neuro: alert and oriented x 3, no tremor MSK: extremities atraumatic, normal gait and station, no peripheral edema Skin: intact, no rashes on exposed skin, no jaundice, no cyanosis Psych: well-groomed, cooperative, good eye contact, euthymic mood, affect mood-congruent, speech is articulate, and thought processes clear and goal-directed  No flowsheet data found.  Tobacco Social History   Tobacco Use  Smoking Status Never Smoker  Smokeless Tobacco Never Used     Counseling given: Not Answered   Clinical Intake:                       Past Medical History:  Diagnosis Date  . Colon cancer ([place])   . DVT, lower extremity ([place]) 1978   Left  . Hyperlipidemia   . Hypertension    Past Surgical History:  Procedure Laterality Date  . ABDOMINAL HYSTERECTOMY    . APPENDECTOMY    . COLON SURGERY     Family History  Problem Relation Age of Onset  . Uterine cancer Mother   . Stroke Father    Social History   Socioeconomic History  . Marital status: Single     Spouse name: Not on file  . Number of children: Not on file  . Years of education: Not on file  . Highest education level: Not on file  Occupational History  . Not on file  Social Needs  . Financial resource strain: Not on file  . Food insecurity:    Worry: Not on file    Inability: Not on file  . Transportation needs:    Medical: Not on file    Non-medical: Not on file  Tobacco Use  . Smoking status: Never Smoker  . Smokeless tobacco: Never Used  Substance and Sexual Activity  . Alcohol use: Yes  . Drug use: No  . Sexual activity: Not on file  Lifestyle  . Physical activity:    Days per week: Not on file    Minutes per session: Not on file  . Stress: Not on file  Relationships  . Social connections:    Talks on phone: Not on file    Gets together: Not on file    Attends religious service: Not on file    Active member of club or organization: Not on file    Attends meetings of clubs or organizations: Not on file    Relationship status: Not on file  Other Topics Concern  . Not on file  Social History Narrative  . Not  on file    Outpatient Encounter Medications as of 08/03/2017  Medication Sig  . aspirin 81 MG tablet Take 81 mg by mouth daily.  . hydrochlorothiazide (HYDRODIURIL) 25 MG tablet Take 1 tablet (25 mg total) by mouth daily.  Marland Kitchen lisinopril (PRINIVIL,ZESTRIL) 20 MG tablet Take 1 tablet (20 mg total) by mouth daily.   No facility-administered encounter medications on file as of 08/03/2017.     Activities of Daily Living No flowsheet data found.  Patient Care Team: Kris Mouton as PCP - General (Physician Assistant)    Assessment:   This is a routine wellness examination for Selena Mccarthy.  Exercise Activities and Dietary recommendations    Goals    . Blood Pressure < 140/90    . DIET - REDUCE SALT INTAKE TO 2 GRAMS PER DAY OR LESS       Fall Risk Fall Risk  08/03/2017  Falls in the past year? No   Is the patient's home free  of loose throw rugs in walkways, pet beds, electrical cords, etc?   yes      Grab bars in the bathroom? no      Handrails on the stairs?   yes      Adequate lighting?   yes  Timed Get Up and Go performed: normal  Depression Screen PHQ 2/9 Scores 08/03/2017 12/22/2016  PHQ - 2 Score 0 0     Cognitive Function     6CIT Screen 08/03/2017  What Year? 0 points  What month? 0 points  What time? 0 points  Count back from 20 0 points  Months in reverse 0 points  Repeat phrase 6 points  Total Score 6    Immunization History  Administered Date(s) Administered  . Pneumococcal Conjugate-13 09/14/2013  . Pneumococcal Polysaccharide-23 05/04/2017    Qualifies for Shingles Vaccine? Yes, declines  Screening Tests Health Maintenance  Topic Date Due  . DEXA SCAN  08/04/2018 (Originally 11/18/2002)  . TETANUS/TDAP  08/04/2018 (Originally 11/17/1954)  . PNA vac Low Risk Adult  Completed    Cancer Screenings: Lung: Low Dose CT Chest recommended if Age 62-80 years, 30 pack-year currently smoking OR have quit w/in 15years. Patient does not qualify. Breast:  Up to date on Mammogram? No longer candidate for mammogram  Up to date of Bone Density/Dexa? No, declines Colorectal: no longer candidate for colon cancer screening      Plan:      Medicare annual wellness visit, subsequent  Uncontrolled stage 2 hypertension  BP Readings from Last 3 Encounters:  08/03/17 (!) 198/108  05/04/17 (!) 157/93  01/06/17 (!) 152/84  - BP extremely elevated on 2 automated and 1 manual check in office today. Patient is asymptomatic - no vision change, chest pain, headache/altered mentation - ED precautions discussed - CMP, Aldosterone, UA, and renal dopplers pending to assess for secondary hypertension - negative STOPBANG - adding Amlodipine 10 mg. Cont Lisinopril 20 mg and HCTZ 25 mg daily - close follow-up in 2 weeks  I have personally reviewed and noted the following in the patient's chart:    . Medical and social history . Use of alcohol, tobacco or illicit drugs  . Current medications and supplements . Functional ability and status . Nutritional status . Physical activity . Advanced directives . List of other physicians - n/a . Hospitalizations, surgeries, and ER visits in previous 12 months - n/a . Vitals . Screenings to include cognitive, depression, and falls  In addition, I have reviewed and discussed  with patient certain preventive protocols, quality metrics, and best practice recommendations. A written personalized care plan for preventive services as well as general preventive health recommendations were provided to patient.     Trixie Dredge, [place]  08/03/2017

## 2017-08-03 NOTE — Patient Instructions (Addendum)
Selena Mccarthy , Thank you for taking time to come for your Medicare Wellness Visit. I appreciate your ongoing commitment to your health goals. Please review the following plan we discussed and let me know if I can assist you in the future.   These are the goals we discussed: Goals    . Blood Pressure < 140/90    . DIET - REDUCE SALT INTAKE TO 2 GRAMS PER DAY OR LESS      Schedule your annual eye exam  This is a list of the screening recommended for you and due dates:  Health Maintenance  Topic Date Due  . DEXA scan (bone density measurement)  11/18/2002  . Tetanus Vaccine  08/04/2018*  . Pneumonia vaccines  Completed  *Topic was postponed. The date shown is not the original due date.    For your blood pressure: - Goal <140/90 - baby aspirin 81 mg daily to help prevent heart attack/stroke - monitor and log blood pressures at home - check around the same time each day in a relaxed setting - Limit salt to <2000 mg/day - Follow DASH eating plan - limit alcohol to 2 standard drinks per day for men and 1 per day for women - avoid tobacco products - weight loss: 7% of current body weight - follow-up every 6 months for your blood pressure    Managing Your Hypertension Hypertension is commonly called high blood pressure. This is when the force of your blood pressing against the walls of your arteries is too strong. Arteries are blood vessels that carry blood from your heart throughout your body. Hypertension forces the heart to work harder to pump blood, and may cause the arteries to become narrow or stiff. Having untreated or uncontrolled hypertension can cause heart attack, stroke, kidney disease, and other problems. What are blood pressure readings? A blood pressure reading consists of a higher number over a lower number. Ideally, your blood pressure should be below 120/80. The first ("top") number is called the systolic pressure. It is a measure of the pressure in your arteries as your  heart beats. The second ("bottom") number is called the diastolic pressure. It is a measure of the pressure in your arteries as the heart relaxes. What does my blood pressure reading mean? Blood pressure is classified into four stages. Based on your blood pressure reading, your health care provider may use the following stages to determine what type of treatment you need, if any. Systolic pressure and diastolic pressure are measured in a unit called mm Hg. Normal  Systolic pressure: below 824.  Diastolic pressure: below 80. Elevated  Systolic pressure: 235-361.  Diastolic pressure: below 80. Hypertension stage 1  Systolic pressure: 443-154.  Diastolic pressure: 00-86. Hypertension stage 2  Systolic pressure: 761 or above.  Diastolic pressure: 90 or above. What health risks are associated with hypertension? Managing your hypertension is an important responsibility. Uncontrolled hypertension can lead to:  A heart attack.  A stroke.  A weakened blood vessel (aneurysm).  Heart failure.  Kidney damage.  Eye damage.  Metabolic syndrome.  Memory and concentration problems.  What changes can I make to manage my hypertension? Hypertension can be managed by making lifestyle changes and possibly by taking medicines. Your health care provider will help you make a plan to bring your blood pressure within a normal range. Eating and drinking  Eat a diet that is high in fiber and potassium, and low in salt (sodium), added sugar, and fat. An example eating plan  is called the DASH (Dietary Approaches to Stop Hypertension) diet. To eat this way: ? Eat plenty of fresh fruits and vegetables. Try to fill half of your plate at each meal with fruits and vegetables. ? Eat whole grains, such as whole wheat pasta, brown rice, or whole grain bread. Fill about one quarter of your plate with whole grains. ? Eat low-fat diary products. ? Avoid fatty cuts of meat, processed or cured meats, and  poultry with skin. Fill about one quarter of your plate with lean proteins such as fish, chicken without skin, beans, eggs, and tofu. ? Avoid premade and processed foods. These tend to be higher in sodium, added sugar, and fat.  Reduce your daily sodium intake. Most people with hypertension should eat less than 1,500 mg of sodium a day.  Limit alcohol intake to no more than 1 drink a day for nonpregnant women and 2 drinks a day for men. One drink equals 12 oz of beer, 5 oz of wine, or 1 oz of hard liquor. Lifestyle  Work with your health care provider to maintain a healthy body weight, or to lose weight. Ask what an ideal weight is for you.  Get at least 30 minutes of exercise that causes your heart to beat faster (aerobic exercise) most days of the week. Activities may include walking, swimming, or biking.  Include exercise to strengthen your muscles (resistance exercise), such as weight lifting, as part of your weekly exercise routine. Try to do these types of exercises for 30 minutes at least 3 days a week.  Do not use any products that contain nicotine or tobacco, such as cigarettes and e-cigarettes. If you need help quitting, ask your health care provider.  Control any long-term (chronic) conditions you have, such as high cholesterol or diabetes. Monitoring  Monitor your blood pressure at home as told by your health care provider. Your personal target blood pressure may vary depending on your medical conditions, your age, and other factors.  Have your blood pressure checked regularly, as often as told by your health care provider. Working with your health care provider  Review all the medicines you take with your health care provider because there may be side effects or interactions.  Talk with your health care provider about your diet, exercise habits, and other lifestyle factors that may be contributing to hypertension.  Visit your health care provider regularly. Your health care  provider can help you create and adjust your plan for managing hypertension. Will I need medicine to control my blood pressure? Your health care provider may prescribe medicine if lifestyle changes are not enough to get your blood pressure under control, and if:  Your systolic blood pressure is 130 or higher.  Your diastolic blood pressure is 80 or higher.  Take medicines only as told by your health care provider. Follow the directions carefully. Blood pressure medicines must be taken as prescribed. The medicine does not work as well when you skip doses. Skipping doses also puts you at risk for problems. Contact a health care provider if:  You think you are having a reaction to medicines you have taken.  You have repeated (recurrent) headaches.  You feel dizzy.  You have swelling in your ankles.  You have trouble with your vision. Get help right away if:  You develop a severe headache or confusion.  You have unusual weakness or numbness, or you feel faint.  You have severe pain in your chest or abdomen.  You vomit repeatedly.  You have trouble breathing. Summary  Hypertension is when the force of blood pumping through your arteries is too strong. If this condition is not controlled, it may put you at risk for serious complications.  Your personal target blood pressure may vary depending on your medical conditions, your age, and other factors. For most people, a normal blood pressure is less than 120/80.  Hypertension is managed by lifestyle changes, medicines, or both. Lifestyle changes include weight loss, eating a healthy, low-sodium diet, exercising more, and limiting alcohol. This information is not intended to replace advice given to you by your health care provider. Make sure you discuss any questions you have with your health care provider. Document Released: 09/22/2011 Document Revised: 11/26/2015 Document Reviewed: 11/26/2015 Elsevier Interactive Patient Education   [employer].

## 2017-08-05 MED ORDER — POTASSIUM CHLORIDE CRYS ER 20 MEQ PO TBCR: EXTENDED_RELEASE_TABLET | ORAL | 0 refills | Status: DC

## 2017-08-05 NOTE — Progress Notes (Signed)
Potassium is a little low This is likely due to hydrochlorothiazde This medication is medically necessary for controlling her blood pressure Would like her to start prescription potassium supplement 20 meq twice a day for 2 days, then 20 meq daily Recheck potassium in 1 week

## 2017-08-05 NOTE — Addendum Note (Signed)
Addended by: Nelson Chimes E on: 08/05/2017 03:47 PM   Modules accepted: Orders

## 2017-08-09 LAB — COMPLETE METABOLIC PANEL WITH GFR
AG Ratio: 1.4 (calc) (ref 1.0–2.5)
ALBUMIN MSPROF: 4.9 g/dL (ref 3.6–5.1)
ALT: 11 U/L (ref 6–29)
AST: 18 U/L (ref 10–35)
Alkaline phosphatase (APISO): 74 U/L (ref 33–130)
BILIRUBIN TOTAL: 0.6 mg/dL (ref 0.2–1.2)
BUN: 13 mg/dL (ref 7–25)
CO2: 30 mmol/L (ref 20–32)
Calcium: 10.4 mg/dL (ref 8.6–10.4)
Chloride: 102 mmol/L (ref 98–110)
Creat: 0.74 mg/dL (ref 0.60–0.93)
GFR, Est African American: 89 mL/min/{1.73_m2} (ref >=60)
GFR, Est Non African American: 77 mL/min/{1.73_m2} (ref >=60)
GLOBULIN: 3.5 g/dL (ref 1.9–3.7)
Glucose, Bld: 87 mg/dL (ref 65–139)
Potassium: 3.3 mmol/L — ABNORMAL LOW (ref 3.5–5.3)
Sodium: 142 mmol/L (ref 135–146)
TOTAL PROTEIN: 8.4 g/dL — AB (ref 6.1–8.1)

## 2017-08-09 LAB — LIPID PANEL W/REFLEX DIRECT LDL
CHOL/HDL RATIO: 3.4 (calc) (ref <5.0)
Cholesterol: 230 mg/dL — ABNORMAL HIGH (ref <200)
HDL: 67 mg/dL (ref >50)
LDL Cholesterol (Calc): 144 mg/dL (calc) — ABNORMAL HIGH
Non-HDL Cholesterol (Calc): 163 mg/dL (calc) — ABNORMAL HIGH (ref <130)
Triglycerides: 89 mg/dL (ref <150)

## 2017-08-09 LAB — ALDOSTERONE + RENIN ACTIVITY W/ RATIO
ALDO / PRA RATIO: 7.7 ratio (ref 0.9–28.9)
Aldosterone: 3 ng/dL
Renin Activity: 0.39 ng/mL/h (ref 0.25–5.82)

## 2017-08-09 LAB — URINALYSIS, ROUTINE W REFLEX MICROSCOPIC
Bilirubin Urine: NEGATIVE
GLUCOSE, UA: NEGATIVE
Hgb urine dipstick: NEGATIVE
Ketones, ur: NEGATIVE
Leukocytes, UA: NEGATIVE
Nitrite: NEGATIVE
Protein, ur: NEGATIVE
SPECIFIC GRAVITY, URINE: 1.014 (ref 1.001–1.03)
pH: 6 (ref 5.0–8.0)

## 2017-08-15 ENCOUNTER — Other Ambulatory Visit: Payer: Self-pay | Admitting: Physician Assistant

## 2017-08-15 ENCOUNTER — Ambulatory Visit (HOSPITAL_COMMUNITY)
Admission: RE | Admit: 2017-08-15 | Discharge: 2017-08-15 | Disposition: A | Discharge status: Home / Self Care | Payer: Medicare Other | Source: Ambulatory Visit | Attending: Family | Admitting: Family

## 2017-08-15 DIAGNOSIS — I10 Essential (primary) hypertension: Secondary | ICD-10-CM

## 2017-08-17 ENCOUNTER — Ambulatory Visit (INDEPENDENT_AMBULATORY_CARE_PROVIDER_SITE_OTHER): Payer: Medicare Other | Admitting: Physician Assistant

## 2017-08-17 ENCOUNTER — Encounter: Payer: Self-pay | Admitting: Physician Assistant

## 2017-08-17 VITALS — BP 162/92 | HR 76 | Wt 125.0 lb

## 2017-08-17 DIAGNOSIS — E876 Hypokalemia: Secondary | ICD-10-CM | POA: Diagnosis not present

## 2017-08-17 DIAGNOSIS — T502X5A Adverse effect of carbonic-anhydrase inhibitors, benzothiadiazides and other diuretics, initial encounter: Secondary | ICD-10-CM | POA: Diagnosis not present

## 2017-08-17 DIAGNOSIS — I10 Essential (primary) hypertension: Secondary | ICD-10-CM | POA: Diagnosis not present

## 2017-08-17 NOTE — Progress Notes (Signed)
HPI:                                                                Selena Mccarthy is a 82 y.o. female who presents to [facility] today for hypertension follow-up  Pleasant 82 yo F with asymptomatic severe hypertension presents for follow-up. BP at [facility] 2 weeks ago was 198/108. She had a normal Aldosterone/renin and normal renal artery ultrasound. CMP showed mild hypokalemia at 3.3. She was given Potassium 20 mg twice a day for 2 days and then once daily. She prefers to increase dietary potassium rather than take a supplement. Has been monitoring BP's at home: BP range 140's/80's, one lower reading of 129/78, which concerned her. She also reports an episode of dizziness at [facility] last week. Was looking down in her purse and when she lifted her head quickly she became dizzy. Symptoms resolved spontaneously.  Depression screen [facility] 2/9 08/03/2017 12/22/2016  Decreased Interest 0 0  Down, Depressed, Hopeless 0 0  PHQ - 2 Score 0 0    No flowsheet data found.    Past Medical History:  Diagnosis Date  . Colon cancer ([place])   . DVT, lower extremity ([place]) 1978   Left  . Hyperlipidemia   . Hypertension    Past Surgical History:  Procedure Laterality Date  . ABDOMINAL HYSTERECTOMY    . APPENDECTOMY    . COLON SURGERY     Social History   Tobacco Use  . Smoking status: Never Smoker  . Smokeless tobacco: Never Used  Substance Use Topics  . Alcohol use: Yes   family history includes Stroke in her father; Uterine cancer in her mother.    ROS: negative except as noted in the HPI  Medications: Current Outpatient Medications  Medication Sig Dispense Refill  . amLODipine (NORVASC) 10 MG tablet Take 1 tablet (10 mg total) by mouth daily. 30 tablet 3  . aspirin 81 MG tablet Take 81 mg by mouth daily.    . hydrochlorothiazide (HYDRODIURIL) 25 MG tablet Take 1 tablet (25 mg total) by mouth daily. 90 tablet 1  . lisinopril  (PRINIVIL,ZESTRIL) 20 MG tablet Take 1 tablet (20 mg total) by mouth daily. 90 tablet 1  . potassium chloride SA (K-DUR,KLOR-CON) 20 MEQ tablet 1 tab PO bid x 2 days, then 1 tab PO daily 30 tablet 0   No current facility-administered medications for this visit.    Allergies  Allergen Reactions  . Influenza Vaccines Anaphylaxis       Objective:  BP (!) 162/92   Pulse 76   Wt 125 lb (56.7 kg)   BMI 22.14 kg/m  Gen:  alert, not ill-appearing, no distress, appears younger than stated age 74: head normocephalic without obvious abnormality, conjunctiva and cornea clear, wearing glasses, trachea midline Pulm: Normal work of breathing, normal phonation, clear to auscultation bilaterally, no wheezes, rales or rhonchi CV: Normal rate, regular rhythm, s1 and s2 distinct, no murmurs, clicks or rubs  Neuro: alert and oriented x 3, no tremor MSK: extremities atraumatic, normal gait and station, no peripheral edema Skin: intact, no rashes on exposed skin, no jaundice, no cyanosis Psych: well-groomed, cooperative, good eye contact, euthymic mood, affect mood-congruent, speech is articulate, and thought processes  clear and goal-directed   Lab Results  Component Value Date   CREATININE 0.74 08/03/2017   BUN 13 08/03/2017   NA 142 08/03/2017   K 3.3 (L) 08/03/2017   CL 102 08/03/2017   CO2 30 08/03/2017     No results found for this or any previous visit (from the past 72 hour(s)). No results found.    Assessment and Plan: 82 y.o. female with   Severe essential hypertension  Diuretic-induced hypokalemia - Plan: BASIC METABOLIC PANEL WITH GFR  Workup for secondary hypertension was negative Home BP's are in range Continue Lisinopril 20 mg, HCTZ 25 mg and Amlodipine 10 mg Rechecking potassium level today   Patient education and anticipatory guidance given Patient agrees with treatment plan Follow-up in 6 months or sooner as needed if symptoms worsen or fail to  improve  Darlyne Russian PA-C

## 2017-08-17 NOTE — Patient Instructions (Addendum)
Managing Your Hypertension Hypertension is commonly called high blood pressure. This is when the force of your blood pressing against the walls of your arteries is too strong. Arteries are blood vessels that carry blood from your heart throughout your body. Hypertension forces the heart to work harder to pump blood, and may cause the arteries to become narrow or stiff. Having untreated or uncontrolled hypertension can cause heart attack, stroke, kidney disease, and other problems. What are blood pressure readings? A blood pressure reading consists of a higher number over a lower number. Ideally, your blood pressure should be below 120/80. The first ("top") number is called the systolic pressure. It is a measure of the pressure in your arteries as your heart beats. The second ("bottom") number is called the diastolic pressure. It is a measure of the pressure in your arteries as the heart relaxes. What does my blood pressure reading mean? Blood pressure is classified into four stages. Based on your blood pressure reading, your health care provider may use the following stages to determine what type of treatment you need, if any. Systolic pressure and diastolic pressure are measured in a unit called mm Hg. Normal  Systolic pressure: below 120.  Diastolic pressure: below 80. Elevated  Systolic pressure: 120-129.  Diastolic pressure: below 80. Hypertension stage 1  Systolic pressure: 130-139.  Diastolic pressure: 80-89. Hypertension stage 2  Systolic pressure: 140 or above.  Diastolic pressure: 90 or above. What health risks are associated with hypertension? Managing your hypertension is an important responsibility. Uncontrolled hypertension can lead to:  A heart attack.  A stroke.  A weakened blood vessel (aneurysm).  Heart failure.  Kidney damage.  Eye damage.  Metabolic syndrome.  Memory and concentration problems.  What changes can I make to manage my  hypertension? Hypertension can be managed by making lifestyle changes and possibly by taking medicines. Your health care provider will help you make a plan to bring your blood pressure within a normal range. Eating and drinking  Eat a diet that is high in fiber and potassium, and low in salt (sodium), added sugar, and fat. An example eating plan is called the DASH (Dietary Approaches to Stop Hypertension) diet. To eat this way: ? Eat plenty of fresh fruits and vegetables. Try to fill half of your plate at each meal with fruits and vegetables. ? Eat whole grains, such as whole wheat pasta, brown rice, or whole grain bread. Fill about one quarter of your plate with whole grains. ? Eat low-fat diary products. ? Avoid fatty cuts of meat, processed or cured meats, and poultry with skin. Fill about one quarter of your plate with lean proteins such as fish, chicken without skin, beans, eggs, and tofu. ? Avoid premade and processed foods. These tend to be higher in sodium, added sugar, and fat.  Reduce your daily sodium intake. Most people with hypertension should eat less than 1,500 mg of sodium a day.  Limit alcohol intake to no more than 1 drink a day for nonpregnant women and 2 drinks a day for men. One drink equals 12 oz of beer, 5 oz of wine, or 1 oz of hard liquor. Lifestyle  Work with your health care provider to maintain a healthy body weight, or to lose weight. Ask what an ideal weight is for you.  Get at least 30 minutes of exercise that causes your heart to beat faster (aerobic exercise) most days of the week. Activities may include walking, swimming, or biking.  Include exercise   to strengthen your muscles (resistance exercise), such as weight lifting, as part of your weekly exercise routine. Try to do these types of exercises for 30 minutes at least 3 days a week.  Do not use any products that contain nicotine or tobacco, such as cigarettes and e-cigarettes. If you need help quitting, ask  your health care provider.  Control any long-term (chronic) conditions you have, such as high cholesterol or diabetes. Monitoring  Monitor your blood pressure at home as told by your health care provider. Your personal target blood pressure may vary depending on your medical conditions, your age, and other factors.  Have your blood pressure checked regularly, as often as told by your health care provider. Working with your health care provider  Review all the medicines you take with your health care provider because there may be side effects or interactions.  Talk with your health care provider about your diet, exercise habits, and other lifestyle factors that may be contributing to hypertension.  Visit your health care provider regularly. Your health care provider can help you create and adjust your plan for managing hypertension. Will I need medicine to control my blood pressure? Your health care provider may prescribe medicine if lifestyle changes are not enough to get your blood pressure under control, and if:  Your systolic blood pressure is 130 or higher.  Your diastolic blood pressure is 80 or higher.  Take medicines only as told by your health care provider. Follow the directions carefully. Blood pressure medicines must be taken as prescribed. The medicine does not work as well when you skip doses. Skipping doses also puts you at risk for problems. Contact a health care provider if:  You think you are having a reaction to medicines you have taken.  You have repeated (recurrent) headaches.  You feel dizzy.  You have swelling in your ankles.  You have trouble with your vision. Get help right away if:  You develop a severe headache or confusion.  You have unusual weakness or numbness, or you feel faint.  You have severe pain in your chest or abdomen.  You vomit repeatedly.  You have trouble breathing. Summary  Hypertension is when the force of blood pumping through  your arteries is too strong. If this condition is not controlled, it may put you at risk for serious complications.  Your personal target blood pressure may vary depending on your medical conditions, your age, and other factors. For most people, a normal blood pressure is less than 120/80.  Hypertension is managed by lifestyle changes, medicines, or both. Lifestyle changes include weight loss, eating a healthy, low-sodium diet, exercising more, and limiting alcohol. This information is not intended to replace advice given to you by your health care provider. Make sure you discuss any questions you have with your health care provider. Document Released: 09/22/2011 Document Revised: 11/26/2015 Document Reviewed: 11/26/2015 Elsevier Interactive Patient Education  2018 [employer].   Hypokalemia Hypokalemia means that the amount of potassium in the blood is lower than normal.Potassium is a chemical that helps regulate the amount of fluid in the body (electrolyte). It also stimulates muscle tightening (contraction) and helps nerves work properly.Normally, most of the body's potassium is inside of cells, and only a very small amount is in the blood. Because the amount in the blood is so small, minor changes to potassium levels in the blood can be life-threatening. What are the causes? This condition may be caused by:  Antibiotic medicine.  Diarrhea or vomiting.  Taking too much of a medicine that helps you have a bowel movement (laxative) can cause diarrhea and lead to hypokalemia.  Chronic kidney disease (CKD).  Medicines that help the body get rid of excess fluid (diuretics).  Eating disorders, such as bulimia.  Low magnesium levels in the body.  Sweating a lot.  What are the signs or symptoms? Symptoms of this condition include:  Weakness.  Constipation.  Fatigue.  Muscle cramps.  Mental confusion.  Skipped heartbeats or irregular heartbeat (palpitations).  Tingling or  numbness.  How is this diagnosed? This condition is diagnosed with a blood test. How is this treated? Hypokalemia can be treated by taking potassium supplements by mouth or adjusting the medicines that you take. Treatment may also include eating more foods that contain a lot of potassium. If your potassium level is very low, you may need to get potassium through an IV tube in one of your veins and be monitored in the hospital. Follow these instructions at home:  Take over-the-counter and prescription medicines only as told by your health care provider. This includes vitamins and supplements.  Eat a healthy diet. A healthy diet includes fresh fruits and vegetables, whole grains, healthy fats, and lean proteins.  If instructed, eat more foods that contain a lot of potassium, such as: ? Nuts, such as peanuts and pistachios. ? Seeds, such as sunflower seeds and pumpkin seeds. ? Peas, lentils, and lima beans. ? Whole grain and bran cereals and breads. ? Fresh fruits and vegetables, such as apricots, avocado, bananas, cantaloupe, kiwi, oranges, tomatoes, asparagus, and potatoes. ? Orange juice. ? Tomato juice. ? Red meats. ? Yogurt.  Keep all follow-up visits as told by your health care provider. This is important. Contact a health care provider if:  You have weakness that gets worse.  You feel your heart pounding or racing.  You vomit.  You have diarrhea.  You have diabetes (diabetes mellitus) and you have trouble keeping your blood sugar (glucose) in your target range. Get help right away if:  You have chest pain.  You have shortness of breath.  You have vomiting or diarrhea that lasts for more than 2 days.  You faint. This information is not intended to replace advice given to you by your health care provider. Make sure you discuss any questions you have with your health care provider. Document Released: 12/28/2004 Document Revised: 08/16/2015 Document Reviewed:  08/16/2015 Elsevier Interactive Patient Education  2018 [employer].

## 2017-08-24 ENCOUNTER — Encounter (HOSPITAL_COMMUNITY): Payer: Medicare Other

## 2018-03-29 ENCOUNTER — Other Ambulatory Visit: Payer: Self-pay

## 2018-03-29 DIAGNOSIS — I10 Essential (primary) hypertension: Secondary | ICD-10-CM

## 2018-03-29 MED ORDER — LISINOPRIL 20 MG PO TABS: 20.0000 mg | ORAL_TABLET | Freq: Every day | ORAL | 0 refills | Status: DC

## 2018-03-29 MED ORDER — HYDROCHLOROTHIAZIDE 25 MG PO TABS: 25.0000 mg | ORAL_TABLET | Freq: Every day | ORAL | 0 refills | Status: DC

## 2018-06-25 ENCOUNTER — Encounter: Payer: Self-pay | Admitting: Emergency Medicine

## 2018-06-25 ENCOUNTER — Emergency Department (INDEPENDENT_AMBULATORY_CARE_PROVIDER_SITE_OTHER)
Admission: EM | Admit: 2018-06-25 | Discharge: 2018-06-25 | Disposition: A | Discharge status: Home / Self Care | Payer: Medicare Other | Source: Home / Self Care

## 2018-06-25 ENCOUNTER — Other Ambulatory Visit: Payer: Self-pay

## 2018-06-25 DIAGNOSIS — R519 Headache, unspecified: Secondary | ICD-10-CM

## 2018-06-25 DIAGNOSIS — I10 Essential (primary) hypertension: Secondary | ICD-10-CM | POA: Diagnosis not present

## 2018-06-25 DIAGNOSIS — R202 Paresthesia of skin: Secondary | ICD-10-CM

## 2018-06-25 LAB — POCT URINALYSIS DIP (MANUAL ENTRY)
Bilirubin, UA: NEGATIVE
Glucose, UA: NEGATIVE mg/dL
Ketones, POC UA: NEGATIVE mg/dL
Leukocytes, UA: NEGATIVE
Nitrite, UA: NEGATIVE
Protein Ur, POC: NEGATIVE mg/dL
Spec Grav, UA: 1.02 (ref 1.010–1.025)
Urobilinogen, UA: 0.2 E.U./dL
pH, UA: 5.5 (ref 5.0–8.0)

## 2018-06-25 NOTE — ED Provider Notes (Signed)
[name]    CSN: [id number] Arrival date & time: 06/25/18  1135     History   Chief Complaint Chief Complaint  Patient presents with  . Numbness    HPI NIKOLETTE REINDL is a 83 y.o. female.   HPI MERSADIES PETREE is a 83 y.o. female presenting to UC with c/o mild posterior head pressure and tingling sensation in both her hands this morning shortly after waking up.  Sensation lasted for about 1 hour and has since resolved but she still feels like there is fluid in the back of her head. Pt notes the last time she felt like this she had a sinus infection. She does report hx of Raynaud's with hand tingling and cold sensation but states that usually only occurs in the winter or when she touches cold objects.  She is here with her daughter who states pt has been acting normal this morning.  Denies confusion or alternated gait. Denies weakness in her hands or feet. Pt denies HA, sinus pressure, sinus congestion, fever, chills, n/v/d. Denies hx of strokes.  She does have hx of poorly controlled HTN despite taking her BP medications as prescribed. She is due for f/u with her PCP, however, her routine f/u was canceled due to change in office policy for inpatient visit due to Covid restrictions.  Pt denies sick contacts or recent travel.   Past Medical History:  Diagnosis Date  . Colon cancer ([place])   . DVT, lower extremity ([place]) 1978   Left  . Hyperlipidemia   . Hypertension     Patient Active Problem List   Diagnosis Date Noted  . Diuretic-induced hypokalemia 08/17/2017  . Medial epicondylitis of elbow, right 05/04/2017  . Elevated blood pressure reading in office with diagnosis of hypertension 01/06/2017  . Uncontrolled stage 2 hypertension 12/22/2016  . History of colon cancer 12/22/2016  . Family history of stroke or transient ischemic attack in father 12/22/2016  . Hyperlipidemia 10/02/2013  . Essential hypertension 09/14/2013    Past Surgical History:  Procedure  Laterality Date  . ABDOMINAL HYSTERECTOMY    . APPENDECTOMY    . COLON SURGERY      OB History   No obstetric history on file.      Home Medications    Prior to Admission medications   Medication Sig Start Date End Date Taking? Authorizing Provider  amLODipine (NORVASC) 10 MG tablet Take 1 tablet (10 mg total) by mouth daily. 08/03/17   Trixie Dredge, PA-C  aspirin 81 MG tablet Take 81 mg by mouth daily.    [provider]  hydrochlorothiazide (HYDRODIURIL) 25 MG tablet Take 1 tablet (25 mg total) by mouth daily. 03/29/18   Trixie Dredge, PA-C  lisinopril (PRINIVIL,ZESTRIL) 20 MG tablet Take 1 tablet (20 mg total) by mouth daily. 03/29/18   Trixie Dredge, PA-C  potassium chloride SA (K-DUR,KLOR-CON) 20 MEQ tablet 1 tab PO bid x 2 days, then 1 tab PO daily 08/05/17   Trixie Dredge, PA-C    Family History Family History  Problem Relation Age of Onset  . Uterine cancer Mother   . Stroke Father     Social History Social History   Tobacco Use  . Smoking status: Never Smoker  . Smokeless tobacco: Never Used  Substance Use Topics  . Alcohol use: Yes  . Drug use: No     Allergies   Influenza vaccines   Review of Systems Review of Systems  Constitutional: Negative  for chills and fever.  HENT: Negative for congestion, ear pain, postnasal drip, rhinorrhea, sinus pressure, sinus pain, sore throat, trouble swallowing and voice change.   Respiratory: Negative for cough and shortness of breath.   Cardiovascular: Negative for chest pain and palpitations.  Gastrointestinal: Negative for abdominal pain, diarrhea, nausea and vomiting.  Musculoskeletal: Negative for arthralgias, back pain and myalgias.  Skin: Negative for rash.  Neurological: Positive for numbness (tingling in both hands) and headaches (posterior head pressure/"fluid sensation"). Negative for dizziness, tremors, seizures, syncope, facial asymmetry, speech  difficulty, weakness and light-headedness.     Physical Exam Triage Vital Signs ED Triage Vitals  Enc Vitals Group     BP 06/25/18 1156 (!) 166/119     Pulse --      Resp 06/25/18 1156 16     Temp 06/25/18 1156 98.5 F (36.9 C)     Temp Source 06/25/18 1156 Oral     SpO2 06/25/18 1156 98 %     Weight 06/25/18 1158 126 lb (57.2 kg)     Height 06/25/18 1158 5\' 3"  (1.6 m)     Head Circumference --      Peak Flow --      Pain Score 06/25/18 1158 0     Pain Loc --      Pain Edu? --      Excl. in [place]? --    Orthostatic VS for the past 24 hrs:  BP- Lying Pulse- Lying BP- Sitting Pulse- Sitting BP- Standing at 0 minutes Pulse- Standing at 0 minutes  06/25/18 1223 152/79 92 125/83 96 123/84 104    Updated Vital Signs BP (!) 166/119 (BP Location: Right Arm)   Temp 98.5 F (36.9 C) (Oral)   Resp 16   Ht 5\' 3"  (1.6 m)   Wt 126 lb (57.2 kg)   SpO2 98%   BMI 22.32 kg/m   Visual Acuity Right Eye Distance:   Left Eye Distance:   Bilateral Distance:    Right Eye Near:   Left Eye Near:    Bilateral Near:     Physical Exam Vitals signs and nursing note reviewed.  Constitutional:      Appearance: Normal appearance. She is well-developed.  HENT:     Head: Normocephalic and atraumatic.     Right Ear: Tympanic membrane normal.     Left Ear: Tympanic membrane normal.     Nose: Nose normal.     Right Sinus: No maxillary sinus tenderness or frontal sinus tenderness.     Left Sinus: No maxillary sinus tenderness or frontal sinus tenderness.     Mouth/Throat:     Lips: Pink.     Mouth: Mucous membranes are moist.     Pharynx: Oropharynx is clear. Uvula midline.  Eyes:     Extraocular Movements: Extraocular movements intact.     Conjunctiva/sclera: Conjunctivae normal.     Pupils: Pupils are equal, round, and reactive to light.  Neck:     Musculoskeletal: Normal range of motion and neck supple. No neck rigidity or muscular tenderness.  Cardiovascular:     Rate and Rhythm:  Normal rate and regular rhythm.  Pulmonary:     Effort: Pulmonary effort is normal. No respiratory distress.     Breath sounds: Normal breath sounds. No stridor. No wheezing, rhonchi or rales.  Musculoskeletal: Normal range of motion.  Skin:    General: Skin is warm and dry.     Capillary Refill: Capillary refill takes less than 2 seconds.  Neurological:     General: No focal deficit present.     Mental Status: She is alert and oriented to person, place, and time.     Cranial Nerves: No cranial nerve deficit.     Sensory: No sensory deficit.     Motor: No weakness.     Coordination: Coordination normal.     Gait: Gait normal.     Deep Tendon Reflexes: Reflexes normal.  Psychiatric:        Mood and Affect: Mood normal.        Behavior: Behavior normal.      UC Treatments / Results  Labs (all labs ordered are listed, but only abnormal results are displayed) Labs Reviewed  POCT URINALYSIS DIP (MANUAL ENTRY) - Abnormal; Notable for the following components:      Result Value   Color, UA other (*)    Blood, UA trace-intact (*)    All other components within normal limits  COMPLETE METABOLIC PANEL WITH GFR  POCT CBC W AUTO DIFF ([facility])    EKG None  Radiology No results found.  Procedures Procedures (including critical care time)  Medications Ordered in UC Medications - No data to display  Initial Impression / Assessment and Plan / UC Course  I have reviewed the triage vital signs and the nursing notes.  Pertinent labs & imaging results that were available during my care of the patient were reviewed by me and considered in my medical decision making (see chart for details).    Normal exam including, normal neuro exam. No evidence of sinus infection at this time. Pt did have drop in systolic BP from lying to sitting with orthostatics but pt denied any symptoms including no dizziness or lightheadedness. Attempted to check CBC and CMP, however, unable to  obtain blood for labs. UA: WNL, reassuring no UTI or other significant findings. Pt safe for discharge home. Encouraged to call her PCP tomorrow to schedule her biannual f/u  Discussed symptoms that warrant emergent care in the ED.  Pt and daughter verbalized understanding and agreement with tx plan.   Final Clinical Impressions(s) / UC Diagnoses   Final diagnoses:  Essential hypertension  Pressure in head  Paresthesia of both hands     Discharge Instructions      Be sure to stay well hydrated and get at least 8 hours of sleep at night.  Please call your primary care provider to schedule your routine follow up and mention today's visit, especially if symptoms return.  Call 911 or have someone drive you to the closest emergency department if you develop a severe headache, change in vision, difficulty walking, one sided weakness or numbness, or your blood pressure remains elevated even after resting and taking your medication.    ED Prescriptions    None     Controlled Substance Prescriptions [place] Controlled Substance Registry consulted? Not Applicable   Tyrell Antonio 06/25/18 1251

## 2018-06-25 NOTE — Discharge Instructions (Signed)
°  Be sure to stay well hydrated and get at least 8 hours of sleep at night.  Please call your primary care provider to schedule your routine follow up and mention today's visit, especially if symptoms return.  Call 911 or have someone drive you to the closest emergency department if you develop a severe headache, change in vision, difficulty walking, one sided weakness or numbness, or your blood pressure remains elevated even after resting and taking your medication.

## 2018-06-25 NOTE — ED Triage Notes (Signed)
Patient did yardwork yesterday and felt fine; today hands are tingling and sense of pressure back of head; CVA screen normal; daughter accompanied. Patient not taking rx for amlodinpine; missed appt. With [place] due to COVID, she has not travelled past 4 weeks.

## 2018-07-11 ENCOUNTER — Ambulatory Visit (INDEPENDENT_AMBULATORY_CARE_PROVIDER_SITE_OTHER): Payer: Medicare Other | Admitting: Physician Assistant

## 2018-07-11 ENCOUNTER — Encounter: Payer: Self-pay | Admitting: Physician Assistant

## 2018-07-11 VITALS — BP 155/69 | HR 78 | Temp 98.1°F | Wt 126.0 lb

## 2018-07-11 DIAGNOSIS — R3129 Other microscopic hematuria: Secondary | ICD-10-CM | POA: Diagnosis not present

## 2018-07-11 DIAGNOSIS — I10 Essential (primary) hypertension: Secondary | ICD-10-CM

## 2018-07-11 DIAGNOSIS — E782 Mixed hyperlipidemia: Secondary | ICD-10-CM | POA: Diagnosis not present

## 2018-07-11 DIAGNOSIS — Z86718 Personal history of other venous thrombosis and embolism: Secondary | ICD-10-CM | POA: Diagnosis not present

## 2018-07-11 MED ORDER — LISINOPRIL 30 MG PO TABS: 30.0000 mg | ORAL_TABLET | Freq: Every day | ORAL | 0 refills | Status: DC

## 2018-07-11 MED ORDER — HYDROCHLOROTHIAZIDE 25 MG PO TABS: 25.0000 mg | ORAL_TABLET | Freq: Every day | ORAL | 0 refills | Status: DC

## 2018-07-11 NOTE — Patient Instructions (Signed)

## 2018-07-11 NOTE — Progress Notes (Signed)
HPI:                                                                Selena Mccarthy is a 83 y.o. female who presents to [facility] today for urgent care follow-up  Patient was seen in urgent care on 06/25/2018 for headache and paresthesias.  She was diagnosed with hypertensive urgency and started on amlodipine 10 mg daily.  Patient states she has not had any headaches since this visit.  She states it felt like there was a On the back of her head and she believes that it was due to her sinuses because she has chronic postnasal drip.  She denies facial pressure, frontal headache, nasal congestion, purulent nasal drainage, fever, chills, malaise.  She self discontinued the amlodipine.  She states in combination with her other medications "she did not feel balanced or good."  She does not want to take any new medication for blood pressure.  She has been monitoring blood pressure at home.  Max systolic blood pressure in the 938H, max diastolic blood pressure in the low 100s.  She states blood pressure fluctuates between 829 and 937 systolic and 16-967 diastolic.  Denies exertional chest pain, orthopnea, PND, dyspnea on exertion, claudication.  She occasionally has irregular heartbeat with activity.  She is very physically active gardening, walking, babysitting her granddaughter.  She reports she was told many years ago that she has a heart arrhythmia.  Denies known history of SVT or A. fib.  Has never taken medication for this.  Depression screen [facility] 2/9 07/11/2018 08/03/2017 12/22/2016  Decreased Interest 0 0 0  Down, Depressed, Hopeless 0 0 0  PHQ - 2 Score 0 0 0    No flowsheet data found.    Past Medical History:  Diagnosis Date  . Colon cancer ([place])   . DVT, lower extremity ([place]) 1978   Left  . Hyperlipidemia   . Hypertension    Past Surgical History:  Procedure Laterality Date  . ABDOMINAL HYSTERECTOMY    . APPENDECTOMY    . COLON  SURGERY     Social History   Tobacco Use  . Smoking status: Never Smoker  . Smokeless tobacco: Never Used  Substance Use Topics  . Alcohol use: Yes   family history includes Stroke in her father; Uterine cancer in her mother.    ROS: negative except as noted in the HPI  Medications: Current Outpatient Medications  Medication Sig Dispense Refill  . aspirin 81 MG tablet Take 81 mg by mouth daily.    . hydrochlorothiazide (HYDRODIURIL) 25 MG tablet Take 1 tablet (25 mg total) by mouth daily. 90 tablet 0  . lisinopril (ZESTRIL) 30 MG tablet Take 1 tablet (30 mg total) by mouth daily. 90 tablet 0  . potassium chloride SA (K-DUR,KLOR-CON) 20 MEQ tablet 1 tab PO bid x 2 days, then 1 tab PO daily (Patient not taking: Reported on 07/11/2018) 30 tablet 0   No current facility-administered medications for this visit.    Allergies  Allergen Reactions  . Influenza Vaccines Anaphylaxis       Objective:  BP (!) 155/69   Pulse 78   Temp 98.1 F (36.7 C) (Oral)   Wt 126 lb (57.2  kg)   BMI 22.32 kg/m  Gen:  alert, not ill-appearing, no distress, appropriate for age 52: head normocephalic without obvious abnormality, conjunctiva and cornea clear, trachea midline, no carotid bruit Pulm: Normal work of breathing, normal phonation, clear to auscultation bilaterally, no wheezes, rales or rhonchi CV: Normal rate, regular rhythm, s1 and s2 distinct, no murmurs, clicks or rubs  Neuro: alert and oriented x 3, no tremor MSK: extremities atraumatic, normal gait and station, no peripheral edema Skin: intact, lichenified hyperpigmented skin of left distal lower extremity Psych: well-groomed, cooperative, good eye contact, euthymic mood, affect mood-congruent, speech is articulate, and thought processes clear and goal-directed  BP Readings from Last 3 Encounters:  07/11/18 (!) 155/69  06/25/18 (!) 166/119  08/17/17 (!) 162/92   Wt Readings from Last 3 Encounters:  07/11/18 126 lb (57.2 kg)   06/25/18 126 lb (57.2 kg)  08/17/17 125 lb (56.7 kg)    Recent Results (from the past 2160 hour(s))  POCT urinalysis dipstick     Status: Abnormal   Collection Time: 06/25/18 12:24 PM  Result Value Ref Range   Color, UA other (A) yellow   Clarity, UA clear clear   Glucose, UA negative negative mg/dL   Bilirubin, UA negative negative   Ketones, POC UA negative negative mg/dL   Spec Grav, UA 1.020 1.010 - 1.025   Blood, UA trace-intact (A) negative   pH, UA 5.5 5.0 - 8.0   Protein Ur, POC negative negative mg/dL   Urobilinogen, UA 0.2 0.2 or 1.0 E.U./dL   Nitrite, UA Negative Negative   Leukocytes, UA Negative Negative     No results found for this or any previous visit (from the past 72 hour(s)). No results found.    Assessment and Plan: 83 y.o. female with  .Diagnoses and all orders for this visit:  Elevated blood pressure reading in office with diagnosis of hypertension -     TSH + free T4 -     COMPLETE METABOLIC PANEL WITH GFR -     Urinalysis, microscopic only  Essential hypertension -     lisinopril (ZESTRIL) 30 MG tablet; Take 1 tablet (30 mg total) by mouth daily. -     hydrochlorothiazide (HYDRODIURIL) 25 MG tablet; Take 1 tablet (25 mg total) by mouth daily. -     TSH + free T4 -     COMPLETE METABOLIC PANEL WITH GFR  Mixed hyperlipidemia -     Lipid Profile  Microscopic hematuria -     Urinalysis, microscopic only  History of deep vein thrombosis (DVT) of lower extremity Comments: age 9, associated with OCP, LLE   BP slightly out of range in office today.  Goal blood pressure less than 140/90 Patient self discontinued amlodipine and does not desire to take a third blood pressure medication Increasing lisinopril to 30 mg She admits she has not been taking potassium supplement.  She occasionally eats a banana.  I advised her that her renal losses of potassium are increased due to the medication she is taking.  If potassium is low on recheck today  she will start Klor-Con 20 mEq daily CMP, TSH, fasting lipids pending today    Patient education and anticipatory guidance given Patient agrees with treatment plan Follow-up in 2 weeks for nurse blood pressure check, followed by 3 months for medication management or sooner as needed if symptoms worsen or fail to improve  Darlyne Russian PA-C

## 2018-07-12 ENCOUNTER — Other Ambulatory Visit: Payer: Self-pay | Admitting: Physician Assistant

## 2018-07-12 DIAGNOSIS — E876 Hypokalemia: Secondary | ICD-10-CM

## 2018-07-12 LAB — COMPLETE METABOLIC PANEL WITH GFR
AG Ratio: 1.5 (calc) (ref 1.0–2.5)
ALT: 9 U/L (ref 6–29)
AST: 18 U/L (ref 10–35)
Albumin: 4.8 g/dL (ref 3.6–5.1)
Alkaline phosphatase (APISO): 52 U/L (ref 37–153)
BUN: 17 mg/dL (ref 7–25)
CO2: 27 mmol/L (ref 20–32)
Calcium: 10.4 mg/dL (ref 8.6–10.4)
Chloride: 102 mmol/L (ref 98–110)
Creat: 0.81 mg/dL (ref 0.60–0.88)
GFR, Est African American: 79 mL/min/{1.73_m2} (ref >=60)
GFR, Est Non African American: 69 mL/min/{1.73_m2} (ref >=60)
Globulin: 3.3 g/dL (calc) (ref 1.9–3.7)
Glucose, Bld: 95 mg/dL (ref 65–99)
Potassium: 3.5 mmol/L (ref 3.5–5.3)
Sodium: 142 mmol/L (ref 135–146)
Total Bilirubin: 0.6 mg/dL (ref 0.2–1.2)
Total Protein: 8.1 g/dL (ref 6.1–8.1)

## 2018-07-12 LAB — URINALYSIS, MICROSCOPIC ONLY
Bacteria, UA: NONE SEEN /HPF
Hyaline Cast: NONE SEEN /LPF
RBC / HPF: NONE SEEN /HPF (ref 0–2)
Squamous Epithelial / LPF: NONE SEEN /HPF (ref <=5)

## 2018-07-12 LAB — LIPID PANEL
Cholesterol: 239 mg/dL — ABNORMAL HIGH (ref <200)
HDL: 63 mg/dL (ref >=50)
LDL Cholesterol (Calc): 155 mg/dL (calc) — ABNORMAL HIGH
Non-HDL Cholesterol (Calc): 176 mg/dL (calc) — ABNORMAL HIGH (ref <130)
Total CHOL/HDL Ratio: 3.8 (calc) (ref <5.0)
Triglycerides: 100 mg/dL (ref <150)

## 2018-07-12 LAB — TSH+FREE T4: TSH W/REFLEX TO FT4: 2.5 mIU/L (ref 0.40–4.50)

## 2018-07-12 MED ORDER — POTASSIUM CHLORIDE CRYS ER 20 MEQ PO TBCR: 20.0000 meq | EXTENDED_RELEASE_TABLET | ORAL | 1 refills | Status: DC

## 2018-07-25 ENCOUNTER — Other Ambulatory Visit: Payer: Self-pay

## 2018-07-25 ENCOUNTER — Ambulatory Visit (INDEPENDENT_AMBULATORY_CARE_PROVIDER_SITE_OTHER): Payer: Medicare Other | Admitting: Physician Assistant

## 2018-07-25 VITALS — BP 131/78 | HR 79 | Wt 125.0 lb

## 2018-07-25 DIAGNOSIS — I10 Essential (primary) hypertension: Secondary | ICD-10-CM

## 2018-07-25 NOTE — Progress Notes (Signed)
Established Patient Office Visit  Subjective:  Patient ID: Selena Mccarthy, female    DOB: 01/27/1935  Age: 83 y.o. MRN: [id number]  CC:  Chief Complaint  Patient presents with  . Hypertension    HPI Selena Mccarthy presents for blood pressure check. Denies chest pain, shortness of breath, dizziness or headaches. Home blood pressure readings -   7/2 127/85 7/4 105/75 7/9 113/75 7/13 126/93 7/13 137/90  Past Medical History:  Diagnosis Date  . Colon cancer ([place])   . DVT, lower extremity ([place]) 1978   Left  . Hyperlipidemia   . Hypertension     Past Surgical History:  Procedure Laterality Date  . ABDOMINAL HYSTERECTOMY    . APPENDECTOMY    . COLON SURGERY      Family History  Problem Relation Age of Onset  . Uterine cancer Mother   . Stroke Father     Social History   Socioeconomic History  . Marital status: Single    Spouse name: Not on file  . Number of children: Not on file  . Years of education: Not on file  . Highest education level: Not on file  Occupational History  . Not on file  Social Needs  . Financial resource strain: Not on file  . Food insecurity    Worry: Not on file    Inability: Not on file  . Transportation needs    Medical: Not on file    Non-medical: Not on file  Tobacco Use  . Smoking status: Never Smoker  . Smokeless tobacco: Never Used  Substance and Sexual Activity  . Alcohol use: Yes  . Drug use: No  . Sexual activity: Not on file  Lifestyle  . Physical activity    Days per week: Not on file    Minutes per session: Not on file  . Stress: Not on file  Relationships  . Social Herbalist on phone: Not on file    Gets together: Not on file    Attends religious service: Not on file    Active member of club or organization: Not on file    Attends meetings of clubs or organizations: Not on file    Relationship status: Not on file  . Intimate partner violence    Fear of current or ex partner: Not on file   Emotionally abused: Not on file    Physically abused: Not on file    Forced sexual activity: Not on file  Other Topics Concern  . Not on file  Social History Narrative  . Not on file    Outpatient Medications Prior to Visit  Medication Sig Dispense Refill  . aspirin 81 MG tablet Take 81 mg by mouth daily.    . hydrochlorothiazide (HYDRODIURIL) 25 MG tablet Take 1 tablet (25 mg total) by mouth daily. 90 tablet 0  . lisinopril (ZESTRIL) 30 MG tablet Take 1 tablet (30 mg total) by mouth daily. 90 tablet 0  . potassium chloride SA (K-DUR) 20 MEQ tablet Take 1 tablet (20 mEq total) by mouth every other day. 1 tab PO bid x 2 days, then 1 tab PO daily 45 tablet 1   No facility-administered medications prior to visit.     Allergies  Allergen Reactions  . Influenza Vaccines Anaphylaxis    ROS Review of Systems    Objective:    Physical Exam  BP 131/78   Pulse 79   Wt 125 lb (56.7 kg)   BMI  22.14 kg/m  Wt Readings from Last 3 Encounters:  07/25/18 125 lb (56.7 kg)  07/11/18 126 lb (57.2 kg)  06/25/18 126 lb (57.2 kg)     There are no preventive care reminders to display for this patient.  There are no preventive care reminders to display for this patient.  No results found for: TSH Lab Results  Component Value Date   WBC 5.2 12/17/2016   HGB 14.6 12/17/2016   HCT 41.5 12/17/2016   MCV 87.0 12/17/2016   PLT 250 12/17/2016   Lab Results  Component Value Date   NA 142 07/11/2018   K 3.5 07/11/2018   CO2 27 07/11/2018   GLUCOSE 95 07/11/2018   BUN 17 07/11/2018   CREATININE 0.81 07/11/2018   BILITOT 0.6 07/11/2018   AST 18 07/11/2018   ALT 9 07/11/2018   PROT 8.1 07/11/2018   CALCIUM 10.4 07/11/2018   Lab Results  Component Value Date   CHOL 239 (H) 07/11/2018   Lab Results  Component Value Date   HDL 63 07/11/2018   Lab Results  Component Value Date   LDLCALC 155 (H) 07/11/2018   Lab Results  Component Value Date   TRIG 100 07/11/2018   Lab  Results  Component Value Date   CHOLHDL 3.8 07/11/2018   No results found for: HGBA1C    Assessment & Plan:  Hypertension - Patient advised to continue you current medications and follow up in 3 months with PCP.   Problem List Items Addressed This Visit    Essential hypertension - Primary      No orders of the defined types were placed in this encounter.   Follow-up: Return in about 3 months (around 10/25/2018) for HTN with Nelson Chimes, PA.Durene Romans, [place], [place]

## 2018-10-06 ENCOUNTER — Telehealth: Payer: Self-pay | Admitting: Physician Assistant

## 2018-10-06 DIAGNOSIS — I10 Essential (primary) hypertension: Secondary | ICD-10-CM

## 2018-10-06 MED ORDER — HYDROCHLOROTHIAZIDE 25 MG PO TABS: 25.0000 mg | ORAL_TABLET | Freq: Every day | ORAL | 0 refills | Status: DC

## 2018-10-06 MED ORDER — LISINOPRIL 30 MG PO TABS: 30.0000 mg | ORAL_TABLET | Freq: Every day | ORAL | 0 refills | Status: DC

## 2018-10-06 NOTE — Telephone Encounter (Signed)
One month refill sent. This will last until she sees new PCP and can get back on 90 day refills.

## 2018-10-06 NOTE — Telephone Encounter (Signed)
Pt needs refill on her HCTZ and Lisinopril.  Thanks.

## 2018-10-09 ENCOUNTER — Other Ambulatory Visit: Payer: Self-pay

## 2018-10-09 ENCOUNTER — Ambulatory Visit: Payer: Medicare Other | Admitting: *Deleted

## 2018-10-09 VITALS — BP 143/83 | HR 67 | Temp 98.3°F | Ht 63.0 in | Wt 124.0 lb

## 2018-10-09 DIAGNOSIS — Z780 Asymptomatic menopausal state: Secondary | ICD-10-CM

## 2018-10-09 DIAGNOSIS — Z13820 Encounter for screening for osteoporosis: Secondary | ICD-10-CM

## 2018-10-09 NOTE — Progress Notes (Signed)
Subjective:   Selena Mccarthy is a 83 y.o. female who presents for Medicare Annual (Subsequent) preventive examination.  Review of Systems:  No ROS.  Medicare Wellness Virtual Visit.  Visual/audio telehealth visit, UTA vital signs.   See social history for additional risk factors.    Cardiac Risk Factors include: advanced age (>62men, >63 women);hypertension Sleep patterns: Getting 6-7 hours of sleep at night. Wakes up 1 time a night to void. Wakes up feeling refreshed.   Home Safety/Smoke Alarms: Feels safe in home. Smoke alarms in place.  Living environment; Lives with daughter in a 2 story home stairs have handrails on them. Shower is walk in shower and no grab bars in place.ordered Seat Belt Safety/Bike Helmet: Wears seat belt.   Female:   Pap- Aged out      Mammo-  Aged out     Dexa scan-  ordered      CCS- Aged out      Objective:     Vitals: BP (!) 143/83   Pulse 67   Temp 98.3 F (36.8 C) (Oral)   Ht 5\' 3"  (1.6 m)   Wt 124 lb (56.2 kg)   SpO2 100%   BMI 21.97 kg/m   Body mass index is 21.97 kg/m.  Advanced Directives 10/09/2018  Does Patient Have a Medical Advance Directive? No  Would patient like information on creating a medical advance directive? No - Patient declined    Tobacco Social History   Tobacco Use  Smoking Status Never Smoker  Smokeless Tobacco Never Used     Counseling given: Not Answered   Clinical Intake:  Pre-visit preparation completed: Yes  Pain : No/denies pain     Nutritional Risks: None Diabetes: No  How often do you need to have someone help you when you read instructions, pamphlets, or other written materials from your doctor or pharmacy?: 1 - Never What is the last grade level you completed in school?: 12  Interpreter Needed?: No  Information entered by :: Orlie Dakin, LPN  Past Medical History:  Diagnosis Date  . Colon cancer ([place])   . DVT, lower extremity ([place]) 1978   Left  . Hyperlipidemia   . Hypertension     Past Surgical History:  Procedure Laterality Date  . ABDOMINAL HYSTERECTOMY    . APPENDECTOMY    . COLON SURGERY     Family History  Problem Relation Age of Onset  . Uterine cancer Mother   . Stroke Father    Social History   Socioeconomic History  . Marital status: Single    Spouse name: Not on file  . Number of children: 4  . Years of education: 62  . Highest education level: 12th grade  Occupational History  . 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 Financial resource strain: Not hard at all  . Food insecurity    Worry: Never true    Inability: Never true  . Transportation needs    Medical: No    Non-medical: No  Tobacco Use  . Smoking status: Never Smoker  . Smokeless tobacco: Never Used  Substance and Sexual Activity  . Alcohol use: Yes    Alcohol/week: 1.0 standard drinks    Types: 1 Shots of liquor per week  . Drug use: No  . Sexual activity: Not Currently  Lifestyle  . Physical activity    Days per week: 7 days    Minutes per session: 60 min  . Stress:  Not at all  Relationships  . Social connections    Talks on phone: More than three times a week    Gets together: More than three times a week    Attends religious service: 1 to 4 times per year    Active member of club or organization: No    Attends meetings of clubs or organizations: Never    Relationship status: Divorced  Other Topics Concern  . Not on file  Social History Narrative   Walks daily 2 times a day    Outpatient Encounter Medications as of 10/09/2018  Medication Sig  . aspirin 81 MG tablet Take 81 mg by mouth daily.  . hydrochlorothiazide (HYDRODIURIL) 25 MG tablet Take 1 tablet (25 mg total) by mouth daily.  Marland Kitchen lisinopril (ZESTRIL) 30 MG tablet Take 1 tablet (30 mg total) by mouth daily.  . potassium chloride SA (K-DUR) 20 MEQ tablet Take 1 tablet (20 mEq total) by mouth every other day. 1 tab PO bid x 2 days, then 1 tab PO daily   No  facility-administered encounter medications on file as of 10/09/2018.     Activities of Daily Living In your present state of health, do you have any difficulty performing the following activities: 10/09/2018  Hearing? N  Vision? N  Difficulty concentrating or making decisions? N  Walking or climbing stairs? N  Dressing or bathing? N  Doing errands, shopping? N  Preparing Food and eating ? N  Using the Toilet? N  In the past six months, have you accidently leaked urine? N  Do you have problems with loss of bowel control? N  Managing your Medications? N  Managing your Finances? N  Housekeeping or managing your Housekeeping? N  Some recent data might be hidden    Patient Care Team: Kris Mouton as PCP - General (Physician Assistant)    Assessment:   This is a routine wellness examination for Selena Mccarthy.Physical assessment deferred to PCP.   Exercise Activities and Dietary recommendations Current Exercise Habits: Home exercise routine, Type of exercise: walking, Time (Minutes): 60, Frequency (Times/Week): 7, Weekly Exercise (Minutes/Week): 420, Intensity: Mild Diet  Eats a healthy diet of vegetables and fruits and proteins Breakfast: bagel and hot tea Lunch:boiled egg and bacon  Dinner: Meat and vegetables along with greens. Eats applesauce everyday. Drinks 1 1/2 bottle of day      Goals    . Blood Pressure < 140/90    . DIET - REDUCE SALT INTAKE TO 2 GRAMS PER DAY OR LESS    . DIET - REDUCE SALT INTAKE TO 2 GRAMS PER DAY OR LESS       Fall Risk Fall Risk  10/09/2018 08/03/2017  Falls in the past year? 0 No  Number falls in past yr: 0 -  Injury with Fall? 0 -  Follow up Falls prevention discussed -   Is the patient's home free of loose throw rugs in walkways, pet beds, electrical cords, etc?   yes      Grab bars in the bathroom? no      Handrails on the stairs?   yes      Adequate lighting?   yes   Depression Screen PHQ 2/9 Scores 10/09/2018 07/11/2018  08/03/2017 12/22/2016  PHQ - 2 Score 0 0 0 0     Cognitive Function     6CIT Screen 10/09/2018 08/03/2017  What Year? 0 points 0 points  What month? 0 points 0 points  What time? 0 points  0 points  Count back from 20 0 points 0 points  Months in reverse 0 points 0 points  Repeat phrase 0 points 6 points  Total Score 0 6    Immunization History  Administered Date(s) Administered  . Pneumococcal Conjugate-13 09/14/2013  . Pneumococcal Polysaccharide-23 05/04/2017    Screening Tests Health Maintenance  Topic Date Due  . TETANUS/TDAP  11[birth year]17/1954  . DEXA SCAN  11/18/2002  . PNA vac Low Risk Adult  Completed        Plan:    Please schedule your next medicare wellness visit with me in 1 yr.  Ms. Ruffer , Thank you for taking time to come for your Medicare Wellness Visit. I appreciate your ongoing commitment to your health goals. Please review the following plan we discussed and let me know if I can assist you in the future.  Continue doing brain stimulating activities (puzzles, reading, adult coloring books, staying active) to keep memory sharp.    These are the goals we discussed: Goals    . Blood Pressure < 140/90    . DIET - REDUCE SALT INTAKE TO 2 GRAMS PER DAY OR LESS    . DIET - REDUCE SALT INTAKE TO 2 GRAMS PER DAY OR LESS       This is a list of the screening recommended for you and due dates:  Health Maintenance  Topic Date Due  . Tetanus Vaccine  11[birth year]17/1954  . DEXA scan (bone density measurement)  11/18/2002  . Pneumonia vaccines  Completed      I have personally reviewed and noted the following in the patient's chart:   . Medical and social history . Use of alcohol, tobacco or illicit drugs  . Current medications and supplements . Functional ability and status . Nutritional status . Physical activity . Advanced directives . List of other physicians . Hospitalizations, surgeries, and ER visits in previous 12 months . Vitals . Screenings to include  cognitive, depression, and falls . Referrals and appointments  In addition, I have reviewed and discussed with patient certain preventive protocols, quality metrics, and best practice recommendations. A written personalized care plan for preventive services as well as general preventive health recommendations were provided to patient.     Joanne Chars, LPN  [phone]

## 2018-10-09 NOTE — Patient Instructions (Addendum)
Please schedule your next medicare wellness visit with me in 1 yr.  Selena Mccarthy , Thank you for taking time to come for your Medicare Wellness Visit. I appreciate your ongoing commitment to your health goals. Please review the following plan we discussed and let me know if I can assist you in the future.  Continue doing brain stimulating activities (puzzles, reading, adult coloring books, staying active) to keep memory sharp.   These are the goals we discussed: Goals    . Blood Pressure < 140/90    . DIET - REDUCE SALT INTAKE TO 2 GRAMS PER DAY OR LESS    . DIET - REDUCE SALT INTAKE TO 2 GRAMS PER DAY OR LESS        Low-Sodium Eating Plan Sodium, which is an element that makes up salt, helps you maintain a healthy balance of fluids in your body. Too much sodium can increase your blood pressure and cause fluid and waste to be held in your body. Your health care provider or dietitian may recommend following this plan if you have high blood pressure (hypertension), kidney disease, liver disease, or heart failure. Eating less sodium can help lower your blood pressure, reduce swelling, and protect your heart, liver, and kidneys. What are tips for following this plan? General guidelines  Most people on this plan should limit their sodium intake to 1,500-2,000 mg (milligrams) of sodium each day. Reading food labels   The Nutrition Facts label lists the amount of sodium in one serving of the food. If you eat more than one serving, you must multiply the listed amount of sodium by the number of servings.  Choose foods with less than 140 mg of sodium per serving.  Avoid foods with 300 mg of sodium or more per serving. Shopping  Look for lower-sodium products, often labeled as "low-sodium" or "no salt added."  Always check the sodium content even if foods are labeled as "unsalted" or "no salt added".  Buy fresh foods. ? Avoid canned foods and premade or frozen meals. ? Avoid canned, cured, or  processed meats  Buy breads that have less than 80 mg of sodium per slice. Cooking  Eat more home-cooked food and less restaurant, buffet, and fast food.  Avoid adding salt when cooking. Use salt-free seasonings or herbs instead of table salt or sea salt. Check with your health care provider or pharmacist before using salt substitutes.  Cook with plant-based oils, such as canola, sunflower, or olive oil. Meal planning  When eating at a restaurant, ask that your food be prepared with less salt or no salt, if possible.  Avoid foods that contain MSG (monosodium glutamate). MSG is sometimes added to [place] food, bouillon, and some canned foods. What foods are recommended? The items listed may not be a complete list. Talk with your dietitian about what dietary choices are best for you. Grains Low-sodium cereals, including oats, puffed wheat and rice, and shredded wheat. Low-sodium crackers. Unsalted rice. Unsalted pasta. Low-sodium bread. Whole-grain breads and whole-grain pasta. Vegetables Fresh or frozen vegetables. "No salt added" canned vegetables. "No salt added" tomato sauce and paste. Low-sodium or reduced-sodium tomato and vegetable juice. Fruits Fresh, frozen, or canned fruit. Fruit juice. Meats and other protein foods Fresh or frozen (no salt added) meat, poultry, seafood, and fish. Low-sodium canned tuna and salmon. Unsalted nuts. Dried peas, beans, and lentils without added salt. Unsalted canned beans. Eggs. Unsalted nut butters. Dairy Milk. Soy milk. Cheese that is naturally low in sodium, such  as ricotta cheese, fresh mozzarella, or Swiss cheese Low-sodium or reduced-sodium cheese. Cream cheese. Yogurt. Fats and oils Unsalted butter. Unsalted margarine with no trans fat. Vegetable oils such as canola or olive oils. Seasonings and other foods Fresh and dried herbs and spices. Salt-free seasonings. Low-sodium mustard and ketchup. Sodium-free salad dressing. Sodium-free light  mayonnaise. Fresh or refrigerated horseradish. Lemon juice. Vinegar. Homemade, reduced-sodium, or low-sodium soups. Unsalted popcorn and pretzels. Low-salt or salt-free chips. What foods are not recommended? The items listed may not be a complete list. Talk with your dietitian about what dietary choices are best for you. Grains Instant hot cereals. Bread stuffing, pancake, and biscuit mixes. Croutons. Seasoned rice or pasta mixes. Noodle soup cups. Boxed or frozen macaroni and cheese. Regular salted crackers. Self-rising flour. Vegetables Sauerkraut, pickled vegetables, and relishes. Olives. [place] fries. Onion rings. Regular canned vegetables (not low-sodium or reduced-sodium). Regular canned tomato sauce and paste (not low-sodium or reduced-sodium). Regular tomato and vegetable juice (not low-sodium or reduced-sodium). Frozen vegetables in sauces. Meats and other protein foods Meat or fish that is salted, canned, smoked, spiced, or pickled. Bacon, ham, sausage, hotdogs, corned beef, chipped beef, packaged lunch meats, salt pork, jerky, pickled herring, anchovies, regular canned tuna, sardines, salted nuts. Dairy Processed cheese and cheese spreads. Cheese curds. Blue cheese. Feta cheese. String cheese. Regular cottage cheese. Buttermilk. Canned milk. Fats and oils Salted butter. Regular margarine. Ghee. Bacon fat. Seasonings and other foods Onion salt, garlic salt, seasoned salt, table salt, and sea salt. Canned and packaged gravies. Worcestershire sauce. Tartar sauce. Barbecue sauce. Teriyaki sauce. Soy sauce, including reduced-sodium. Steak sauce. Fish sauce. Oyster sauce. Cocktail sauce. Horseradish that you find on the shelf. Regular ketchup and mustard. Meat flavorings and tenderizers. Bouillon cubes. Hot sauce and Tabasco sauce. Premade or packaged marinades. Premade or packaged taco seasonings. Relishes. Regular salad dressings. Salsa. Potato and tortilla chips. Corn chips and puffs. Salted  popcorn and pretzels. Canned or dried soups. Pizza. Frozen entrees and pot pies. Summary  Eating less sodium can help lower your blood pressure, reduce swelling, and protect your heart, liver, and kidneys.  Most people on this plan should limit their sodium intake to 1,500-2,000 mg (milligrams) of sodium each day.  Canned, boxed, and frozen foods are high in sodium. Restaurant foods, fast foods, and pizza are also very high in sodium. You also get sodium by adding salt to food.  Try to cook at home, eat more fresh fruits and vegetables, and eat less fast food, canned, processed, or prepared foods. This information is not intended to replace advice given to you by your health care provider. Make sure you discuss any questions you have with your health care provider. Document Released: 06/19/2001 Document Revised: 12/10/2016 Document Reviewed: 12/22/2015 Elsevier Patient Education  2020 [employer].

## 2018-10-18 ENCOUNTER — Other Ambulatory Visit: Payer: Self-pay

## 2018-10-18 ENCOUNTER — Ambulatory Visit (INDEPENDENT_AMBULATORY_CARE_PROVIDER_SITE_OTHER): Payer: Medicare Other

## 2018-10-18 DIAGNOSIS — M810 Age-related osteoporosis without current pathological fracture: Secondary | ICD-10-CM | POA: Diagnosis not present

## 2018-10-18 DIAGNOSIS — Z780 Asymptomatic menopausal state: Secondary | ICD-10-CM

## 2018-10-18 DIAGNOSIS — Z13820 Encounter for screening for osteoporosis: Secondary | ICD-10-CM

## 2018-10-18 DIAGNOSIS — M85852 Other specified disorders of bone density and structure, left thigh: Secondary | ICD-10-CM | POA: Diagnosis not present

## 2018-10-26 ENCOUNTER — Ambulatory Visit: Payer: Medicare Other | Admitting: Physician Assistant

## 2018-10-27 ENCOUNTER — Ambulatory Visit (INDEPENDENT_AMBULATORY_CARE_PROVIDER_SITE_OTHER): Payer: Medicare Other | Admitting: Osteopathic Medicine

## 2018-10-27 ENCOUNTER — Other Ambulatory Visit: Payer: Self-pay

## 2018-10-27 ENCOUNTER — Encounter: Payer: Self-pay | Admitting: Osteopathic Medicine

## 2018-10-27 VITALS — BP 144/87 | HR 66 | Temp 97.8°F | Ht 63.0 in | Wt 126.7 lb

## 2018-10-27 DIAGNOSIS — I10 Essential (primary) hypertension: Secondary | ICD-10-CM

## 2018-10-27 NOTE — Patient Instructions (Signed)
Labs today  Will refill medications

## 2018-10-27 NOTE — Progress Notes (Signed)
HPI: Selena Mccarthy is a 83 y.o. female who  has a past medical history of Colon cancer ([place]), DVT, lower extremity ([place]) (1978), Hyperlipidemia, and Hypertension.  she presents to [facility], 10/27/18,  for chief complaint of:  BP monitoring   Patient here today for blood pressure check, no chest pain pressure, no headache or vision change, no dizziness.  Would also like potassium levels checked.  BP Readings from Last 3 Encounters:  10/27/18 (!) 144/87  10/09/18 (!) 143/83  07/25/18 131/78     At today's visit 10/27/18 ... PMH, PSH, FH reviewed and updated as needed.  Current medication list and allergy/intolerance hx reviewed and updated as needed. (See remainder of HPI, ROS, Phys Exam below)   No results found.  No results found for this or any previous visit (from the past 72 hour(s)).    Recent Results (from the past 2160 hour(s))  BASIC METABOLIC PANEL WITH GFR     Status: None   Collection Time: 10/27/18 11:29 AM  Result Value Ref Range   Glucose, Bld 74 65 - 99 mg/dL    Comment: .            Fasting reference interval .    BUN 14 7 - 25 mg/dL   Creat 0.64 0.60 - 0.88 mg/dL    Comment: For patients >2 years of age, the reference limit for Creatinine is approximately 13% higher for people identified as African-American. .    GFR, Est Non African American 84 > OR = 60 mL/min/1.81m2   GFR, Est African American 98 > OR = 60 mL/min/1.101m2   BUN/Creatinine Ratio NOT APPLICABLE 6 - 22 (calc)   Sodium 142 135 - 146 mmol/L   Potassium 3.9 3.5 - 5.3 mmol/L   Chloride 105 98 - 110 mmol/L   CO2 28 20 - 32 mmol/L   Calcium 9.8 8.6 - 10.4 mg/dL       ASSESSMENT/PLAN: The encounter diagnosis was Essential hypertension.   Orders Placed This Encounter  Procedures  . BASIC METABOLIC PANEL WITH GFR        Patient Instructions  Labs today  Will refill medications     Advised patient okay to stay off of potassium  if desired as long as blood work is looking okay, would eat a banana maybe every other day.  Pressure is a bit above goal but given patient's age and currently tolerating medications she would rather not mess with medications unless we have to.  I think okay to monitor.    Follow-up plan: Return in about 6 months (around 04/27/2019) for [place] (call week prior to visit for lab orders).                                                 ################################################# ################################################# ################################################# #################################################    No outpatient medications have been marked as taking for the 10/27/18 encounter (Appointment) with [name], DO.    Allergies  Allergen Reactions  . Influenza Vaccines Anaphylaxis       Review of Systems:  Constitutional: No recent illness  HEENT: No  headache, no vision change  Cardiac: No  chest pain, No  pressure, No palpitations  Respiratory:  No  shortness of breath. No  Cough  Neurologic: No  weakness, No  Dizziness  Psychiatric: No  concerns with depression, No  concerns with anxiety  Exam:  BP (!) 144/87   Pulse 66   Temp 97.8 F (36.6 C) (Oral)   Ht 5\' 3"  (1.6 m)   Wt 126 lb 11.2 oz (57.5 kg)   BMI 22.44 kg/m   Constitutional: VS see above. General Appearance: alert, well-developed, well-nourished, NAD  Eyes: Normal lids and conjunctive, non-icteric sclera  Ears, Nose, Mouth, Throat: MMM, Normal external inspection ears/nares/mouth/lips/gums.  Neck: No masses, trachea midline.   Respiratory: Normal respiratory effort. no wheeze, no rhonchi, no rales  Cardiovascular: S1/S2 normal, no murmur, no rub/gallop auscultated. RRR.   Musculoskeletal: Gait normal. Symmetric and independent movement of all extremities  Neurological: Normal balance/coordination. No  tremor.  Skin: warm, dry, intact.   Psychiatric: Normal judgment/insight. Normal mood and affect. Oriented x3.       Visit summary with medication list and pertinent instructions was printed for patient to review, patient was advised to alert [place] if any updates are needed. All questions at time of visit were answered - patient instructed to contact office with any additional concerns. ER/RTC precautions were reviewed with the patient and understanding verbalized.      Please note: voice recognition software was used to produce this document, and typos may escape review. Please contact Dr. Sheppard Coil for any needed clarifications.    Follow up plan: Return in about 6 months (around 04/27/2019) for [name] (call week prior to visit for lab orders).

## 2018-10-28 LAB — BASIC METABOLIC PANEL WITH GFR
BUN: 14 mg/dL (ref 7–25)
CO2: 28 mmol/L (ref 20–32)
Calcium: 9.8 mg/dL (ref 8.6–10.4)
Chloride: 105 mmol/L (ref 98–110)
Creat: 0.64 mg/dL (ref 0.60–0.88)
GFR, Est African American: 98 mL/min/{1.73_m2} (ref >=60)
GFR, Est Non African American: 84 mL/min/{1.73_m2} (ref >=60)
Glucose, Bld: 74 mg/dL (ref 65–99)
Potassium: 3.9 mmol/L (ref 3.5–5.3)
Sodium: 142 mmol/L (ref 135–146)

## 2018-12-18 ENCOUNTER — Other Ambulatory Visit: Payer: Self-pay

## 2018-12-18 ENCOUNTER — Other Ambulatory Visit: Payer: Self-pay | Admitting: Osteopathic Medicine

## 2018-12-18 DIAGNOSIS — I10 Essential (primary) hypertension: Secondary | ICD-10-CM

## 2018-12-18 MED ORDER — HYDROCHLOROTHIAZIDE 25 MG PO TABS: 25.0000 mg | ORAL_TABLET | Freq: Every day | ORAL | 1 refills | Status: DC

## 2018-12-18 NOTE — Telephone Encounter (Signed)
Requested medication (s) are due for refill today: yes  Requested medication (s) are on the active medication list: yes  Last refill:  10/06/2018  Future visit scheduled: yes  Notes to clinic:  Review for refill   Requested Prescriptions  Pending Prescriptions Disp Refills   hydrochlorothiazide (HYDRODIURIL) 25 MG tablet [Pharmacy Med Name: hydroCHLOROthiazide 25 MG Oral Tablet] 30 tablet 0    Sig: Take 1 tablet by mouth once daily     Cardiovascular: Diuretics - Thiazide Failed - 12/18/2018  9:39 AM      Failed - Last BP in normal range    BP Readings from Last 1 Encounters:  10/27/18 (!) 144/87         Passed - Ca in normal range and within 360 days    Calcium  Date Value Ref Range Status  10/27/2018 9.8 8.6 - 10.4 mg/dL Final         Passed - Cr in normal range and within 360 days    Creat  Date Value Ref Range Status  10/27/2018 0.64 0.60 - 0.88 mg/dL Final    Comment:    For patients >24 years of age, the reference limit for Creatinine is approximately 13% higher for people identified as African-American. .          Passed - K in normal range and within 360 days    Potassium  Date Value Ref Range Status  10/27/2018 3.9 3.5 - 5.3 mmol/L Final         Passed - Na in normal range and within 360 days    Sodium  Date Value Ref Range Status  10/27/2018 142 135 - 146 mmol/L Final         Passed - Valid encounter within last 6 months    Recent Outpatient Visits          1 month ago Essential hypertension   [place], Lanelle Bal, DO   4 months ago Essential hypertension   [place], [place], PA-C   5 months ago Elevated blood pressure reading in office with diagnosis of hypertension   [facility], Elson Areas, PA-C   1 year ago Severe essential hypertension   [facility], Elson Areas, PA-C   1 year ago Medicare annual wellness visit, subsequent   [place], Elson Areas, PA-C      Future Appointments            In 9 months [place]

## 2019-01-31 ENCOUNTER — Telehealth: Payer: Self-pay

## 2019-01-31 NOTE — Telephone Encounter (Signed)
Pt called wanting information about the COVID-19 vaccine. Per protocol, advised pt to check with the local health department or go to the website [email or web address]. Informed her that there may be some pharmacies that offer the vaccine but we did not know which ones. Pt verbalized understanding of instructions.

## 2019-09-01 ENCOUNTER — Other Ambulatory Visit: Payer: Self-pay | Admitting: Osteopathic Medicine

## 2019-09-01 DIAGNOSIS — I10 Essential (primary) hypertension: Secondary | ICD-10-CM

## 2019-09-12 ENCOUNTER — Other Ambulatory Visit: Payer: Self-pay | Admitting: Osteopathic Medicine

## 2019-09-12 DIAGNOSIS — I10 Essential (primary) hypertension: Secondary | ICD-10-CM

## 2019-10-10 ENCOUNTER — Ambulatory Visit: Payer: Medicare Other

## 2019-10-22 ENCOUNTER — Other Ambulatory Visit: Payer: Self-pay | Admitting: Osteopathic Medicine

## 2019-10-22 DIAGNOSIS — I10 Essential (primary) hypertension: Secondary | ICD-10-CM

## 2019-11-15 ENCOUNTER — Other Ambulatory Visit: Payer: Self-pay | Admitting: Osteopathic Medicine

## 2019-11-15 DIAGNOSIS — I10 Essential (primary) hypertension: Secondary | ICD-10-CM

## 2019-11-23 ENCOUNTER — Telehealth: Payer: Self-pay | Admitting: *Deleted

## 2019-11-23 NOTE — Telephone Encounter (Signed)
Selena Mccarthy called today after making a house call to pt to report bp and PAD screen findings.  Pt's blood pressures were 170/90 in left arm and 160/80 in right arm.  PAD screen shows in left foot/hand was 0.67 (moderate) and right foot/hand was 0.62 (moderate).

## 2019-11-28 ENCOUNTER — Encounter: Payer: Medicare Other | Admitting: Medical-Surgical

## 2019-11-30 ENCOUNTER — Ambulatory Visit (INDEPENDENT_AMBULATORY_CARE_PROVIDER_SITE_OTHER): Payer: Medicare Other | Admitting: Medical-Surgical

## 2019-11-30 ENCOUNTER — Other Ambulatory Visit: Payer: Self-pay

## 2019-11-30 ENCOUNTER — Encounter: Payer: Self-pay | Admitting: Medical-Surgical

## 2019-11-30 VITALS — BP 171/94 | HR 71 | Temp 98.3°F | Ht 61.0 in | Wt 125.9 lb

## 2019-11-30 DIAGNOSIS — Z0000 Encounter for general adult medical examination without abnormal findings: Secondary | ICD-10-CM | POA: Diagnosis not present

## 2019-11-30 NOTE — Progress Notes (Signed)
Subjective:   Selena Mccarthy is a 84 y.o. female who presents for Medicare Annual (Subsequent) preventive examination.  Review of Systems    No fevers, chills, night sweats, weight changes, SOB, CP, palpitations, lower extremity edema, diarrhea/nausea/vomiting, dysuria, headaches, dizziness, anxiety/depression, or sleeping difficulty.   BP significantly elevated today. Reports she was taken off of her Lisinopril but does not know why. Notes her BP is always elevated. No labs since 10/2018. Last in office follow up for BP in 10/2018.     Objective:    Today's Vitals   11/30/19 1035  BP: (!) 171/94  Pulse: 71  Temp: 98.3 F (36.8 C)  TempSrc: Oral  SpO2: 98%  Weight: 125 lb 14.4 oz (57.1 kg)  Height: 5\' 1"  (1.549 m)   Body mass index is 23.79 kg/m.  Advanced Directives 11/30/2019 10/09/2018  Does Patient Have a Medical Advance Directive? No No  Would patient like information on creating a medical advance directive? Yes (MAU/Ambulatory/Procedural Areas - Information given) No - Patient declined    Current Medications (verified) Outpatient Encounter Medications as of 11/30/2019  Medication Sig  . aspirin 81 MG tablet Take 81 mg by mouth daily.  . hydrochlorothiazide (HYDRODIURIL) 25 MG tablet TAKE 1 TABLET (25 MG TOTAL) BY MOUTH DAILY. LABS/APPT FOR REFILLS  . potassium chloride SA (K-DUR) 20 MEQ tablet Take 1 tablet (20 mEq total) by mouth every other day. 1 tab PO bid x 2 days, then 1 tab PO daily  . lisinopril (ZESTRIL) 30 MG tablet Take 1 tablet (30 mg total) by mouth daily. (Patient not taking: Reported on 11/30/2019)   No facility-administered encounter medications on file as of 11/30/2019.   Allergies (verified) Influenza vaccines   History: Past Medical History:  Diagnosis Date  . Colon cancer ([place])   . DVT, lower extremity ([place]) 1978   Left  . Hyperlipidemia   . Hypertension    Past Surgical History:  Procedure Laterality Date  . ABDOMINAL HYSTERECTOMY    .  APPENDECTOMY    . COLON SURGERY     Family History  Problem Relation Age of Onset  . Uterine cancer Mother   . Stroke Father    Social History   Socioeconomic History  . Marital status: Single    Spouse name: Not on file  . Number of children: 4  . Years of education: 48  . Highest education level: 12th grade  Occupational History  . Occupation: Scientist, product/process development    Comment: retired  Tobacco Use  . Smoking status: Never Smoker  . Smokeless tobacco: Never Used  Vaping Use  . Vaping Use: Never used  Substance and Sexual Activity  . Alcohol use: Yes    Comment: Occasionally  . Drug use: No  . Sexual activity: Not Currently    Birth control/protection: Surgical    Comment: Hysterectomy  Other Topics Concern  . Not on file  Social History Narrative   Walks daily 2 times a day   Social Determinants of Health   Financial Resource Strain:   . Difficulty of Paying Living Expenses: Not on file  Food Insecurity:   . Worried About Charity fundraiser in the Last Year: Not on file  . Ran Out of Food in the Last Year: Not on file  Transportation Needs:   . Lack of Transportation (Medical): Not on file  . Lack of Transportation (Non-Medical): Not on file  Physical Activity:   . Days of Exercise per Week: Not on file  .  Minutes of Exercise per Session: Not on file  Stress:   . Feeling of Stress : Not on file  Social Connections:   . Frequency of Communication with Friends and Family: Not on file  . Frequency of Social Gatherings with Friends and Family: Not on file  . Attends Religious Services: Not on file  . Active Member of Clubs or Organizations: Not on file  . Attends Archivist Meetings: Not on file  . Marital Status: Not on file    Tobacco Counseling Counseling given: Not Answered   Clinical Intake:  Pre-visit preparation completed: Yes  Pain : No/denies pain     BMI - recorded: 23.79 Nutritional Status: BMI of 19-24  Normal Diabetes:  No  How often do you need to have someone help you when you read instructions, pamphlets, or other written materials from your doctor or pharmacy?: 1 - Never What is the last grade level you completed in school?: High school diploma  Diabetic? No  Interpreter Needed?: No  Information entered by :: [place] of Daily Living In your present state of health, do you have any difficulty performing the following activities: 11/30/2019  Hearing? N  Vision? N  Difficulty concentrating or making decisions? N  Walking or climbing stairs? N  Dressing or bathing? N  Doing errands, shopping? N  Preparing Food and eating ? N  Using the Toilet? N  In the past six months, have you accidently leaked urine? N  Do you have problems with loss of bowel control? N  Managing your Medications? N  Managing your Finances? N  Some recent data might be hidden    Patient Care Team: [name], DO as PCP - General (Osteopathic Medicine)  Indicate any recent Medical Services you may have received from other than [facility] providers in the past year (date may be approximate).     Assessment:   This is a routine wellness examination for [place].  Hearing/Vision screen No exam data present  Dietary issues and exercise activities discussed: Current Exercise Habits: The patient has a physically strenuous job, but has no regular exercise apart from work.  Goals    . Blood Pressure < 140/90    . DIET - REDUCE SALT INTAKE TO 2 GRAMS PER DAY OR LESS    . Patient Stated     Stay healthy and active.      Depression Screen PHQ 2/9 Scores 11/30/2019 10/09/2018 07/11/2018 08/03/2017 12/22/2016  PHQ - 2 Score 0 0 0 0 0    Fall Risk Fall Risk  11/30/2019 10/09/2018 08/03/2017  Falls in the past year? 0 0 No  Number falls in past yr: 0 0 -  Injury with Fall? 0 0 -  Follow up Falls evaluation completed Falls prevention discussed -    Any stairs in or around the home? Yes  If so, are there any  without handrails? No  Home free of loose throw rugs in walkways, pet beds, electrical cords, etc? No  Adequate lighting in your home to reduce risk of falls? Yes   ASSISTIVE DEVICES UTILIZED TO PREVENT FALLS:  Life alert? No  Use of a cane, walker or w/c? No  Grab bars in the bathroom? No  Shower chair or bench in shower? Yes  Elevated toilet seat or a handicapped toilet? No   Gait steady and fast without use of assistive device  Cognitive Function:     6CIT Screen 11/30/2019 10/09/2018 08/03/2017  What Year? 0 points  0 points 0 points  What month? 0 points 0 points 0 points  What time? 0 points 0 points 0 points  Count back from 20 0 points 0 points 0 points  Months in reverse 0 points 0 points 0 points  Repeat phrase 0 points 0 points 6 points  Total Score 0 0 6    Immunizations Immunization History  Administered Date(s) Administered  . PFIZER SARS-COV-2 Vaccination 05/30/2019, 06/27/2019  . Pneumococcal Conjugate-13 09/14/2013  . Pneumococcal Polysaccharide-23 05/04/2017    TDAP status: Due, Education has been provided regarding the importance of this vaccine. Advised may receive this vaccine at local pharmacy or Health Dept. Aware to provide a copy of the vaccination record if obtained from local pharmacy or Health Dept. Verbalized acceptance and understanding. Flu Vaccine status: Declined, Education has been provided regarding the importance of this vaccine but patient still declined. Advised may receive this vaccine at local pharmacy or Health Dept. Aware to provide a copy of the vaccination record if obtained from local pharmacy or Health Dept. Verbalized acceptance and understanding. Pneumococcal vaccine status: Up to date Covid-19 vaccine status: Completed vaccines  Qualifies for Shingles Vaccine? No   Zostavax completed No   Shingrix Completed?: No.    Education has been provided regarding the importance of this vaccine. Patient has been advised to call insurance  company to determine out of pocket expense if they have not yet received this vaccine. Advised may also receive vaccine at local pharmacy or Health Dept. Verbalized acceptance and understanding.  Screening Tests Health Maintenance  Topic Date Due  . TETANUS/TDAP  10/27/2022 (Originally 11/17/1954)  . DEXA SCAN  Completed  . COVID-19 Vaccine  Completed  . PNA vac Low Risk Adult  Completed    Health Maintenance  There are no preventive care reminders to display for this patient.  Colorectal cancer screening: No longer required.  Mammogram status: No longer required.  Bone Density status: Completed 2020. Results reflect: Bone density results: OSTEOPOROSIS. Repeat every 2 years.  Lung Cancer Screening: (Low Dose CT Chest recommended if Age 71-80 years, 30 pack-year currently smoking OR have quit w/in 15years.) does not qualify.   Lung Cancer Screening Referral: NA  Additional Screening:  Hepatitis C Screening: does not qualify; Completed NA  Vision Screening: Recommended annual ophthalmology exams for early detection of glaucoma and other disorders of the eye. Is the patient up to date with their annual eye exam?  Yes  Who is the provider or what is the name of the office in which the patient attends annual eye exams? [facility] works in [employer] If pt is not established with a provider, would they like to be referred to a provider to establish care? No .   Dental Screening: Recommended annual dental exams for proper oral hygiene  Community Resource Referral / Chronic Care Management: CRR required this visit?  No   CCM required this visit?  No      Plan:     Ms. Bandel , Thank you for taking time to come for your Medicare Wellness Visit. I appreciate your ongoing commitment to your health goals. Please review the following plan we discussed and let me know if I can assist you in the future.   These are the goals we discussed: Goals    . Blood Pressure < 140/90    . DIET - REDUCE SALT  INTAKE TO 2 GRAMS PER DAY OR LESS    . Patient Stated     Stay healthy and  active.       This is a list of the screening recommended for you and due dates:  Health Maintenance  Topic Date Due  . Tetanus Vaccine  10/27/2022*  . DEXA scan (bone density measurement)  Completed  . COVID-19 Vaccine  Completed  . Pneumonia vaccines  Completed  *Topic was postponed. The date shown is not the original due date.    I have personally reviewed and noted the following in the patient's chart:   . Medical and social history . Use of alcohol, tobacco or illicit drugs  . Current medications and supplements . Functional ability and status . Nutritional status . Physical activity . Advanced directives . List of other physicians . Hospitalizations, surgeries, and ER visits in previous 12 months . Vitals . Screenings to include cognitive, depression, and falls . Referrals and appointments  In addition, I have reviewed and discussed with patient certain preventive protocols, quality metrics, and best practice recommendations. A written personalized care plan for preventive services as well as general preventive health recommendations were provided to patient.   Clearnce Sorrel, DNP, APRN, FNP-BC [facility]

## 2019-11-30 NOTE — Patient Instructions (Signed)
  Ms. Fitzsimmons , Thank you for taking time to come for your Medicare Wellness Visit. I appreciate your ongoing commitment to your health goals. Please review the following plan we discussed and let me know if I can assist you in the future.   These are the goals we discussed: Goals    . Blood Pressure < 140/90    . DIET - REDUCE SALT INTAKE TO 2 GRAMS PER DAY OR LESS    . Patient Stated     Stay healthy and active.       This is a list of the screening recommended for you and due dates:  Health Maintenance  Topic Date Due  . Tetanus Vaccine  10/27/2022*  . DEXA scan (bone density measurement)  Completed  . COVID-19 Vaccine  Completed  . Pneumonia vaccines  Completed  *Topic was postponed. The date shown is not the original due date.

## 2019-12-01 ENCOUNTER — Other Ambulatory Visit: Payer: Self-pay | Admitting: Osteopathic Medicine

## 2019-12-01 DIAGNOSIS — I10 Essential (primary) hypertension: Secondary | ICD-10-CM

## 2019-12-03 ENCOUNTER — Other Ambulatory Visit: Payer: Self-pay

## 2019-12-03 DIAGNOSIS — E782 Mixed hyperlipidemia: Secondary | ICD-10-CM

## 2019-12-03 DIAGNOSIS — I10 Essential (primary) hypertension: Secondary | ICD-10-CM

## 2019-12-03 NOTE — Progress Notes (Signed)
Lab ordered.

## 2019-12-04 ENCOUNTER — Ambulatory Visit (INDEPENDENT_AMBULATORY_CARE_PROVIDER_SITE_OTHER): Payer: Medicare Other | Admitting: Medical-Surgical

## 2019-12-04 ENCOUNTER — Encounter: Payer: Self-pay | Admitting: Medical-Surgical

## 2019-12-04 VITALS — BP 153/94 | HR 69 | Temp 97.9°F | Ht 63.0 in | Wt 126.0 lb

## 2019-12-04 DIAGNOSIS — I10 Essential (primary) hypertension: Secondary | ICD-10-CM

## 2019-12-04 DIAGNOSIS — Z0000 Encounter for general adult medical examination without abnormal findings: Secondary | ICD-10-CM | POA: Diagnosis not present

## 2019-12-04 DIAGNOSIS — E782 Mixed hyperlipidemia: Secondary | ICD-10-CM

## 2019-12-04 LAB — COMPLETE METABOLIC PANEL WITH GFR
AG Ratio: 1.4 (calc) (ref 1.0–2.5)
ALT: 7 U/L (ref 6–29)
AST: 16 U/L (ref 10–35)
Albumin: 4.5 g/dL (ref 3.6–5.1)
Alkaline phosphatase (APISO): 55 U/L (ref 37–153)
BUN: 14 mg/dL (ref 7–25)
CO2: 27 mmol/L (ref 20–32)
Calcium: 10.3 mg/dL (ref 8.6–10.4)
Chloride: 103 mmol/L (ref 98–110)
Creat: 0.74 mg/dL (ref 0.60–0.88)
GFR, Est African American: 87 mL/min/{1.73_m2} (ref >=60)
GFR, Est Non African American: 75 mL/min/{1.73_m2} (ref >=60)
Globulin: 3.3 g/dL (calc) (ref 1.9–3.7)
Glucose, Bld: 101 mg/dL — ABNORMAL HIGH (ref 65–99)
Potassium: 4.2 mmol/L (ref 3.5–5.3)
Sodium: 144 mmol/L (ref 135–146)
Total Bilirubin: 0.5 mg/dL (ref 0.2–1.2)
Total Protein: 7.8 g/dL (ref 6.1–8.1)

## 2019-12-04 LAB — LIPID PANEL W/REFLEX DIRECT LDL
Cholesterol: 217 mg/dL — ABNORMAL HIGH (ref <200)
HDL: 64 mg/dL (ref >=50)
LDL Cholesterol (Calc): 135 mg/dL (calc) — ABNORMAL HIGH
Non-HDL Cholesterol (Calc): 153 mg/dL (calc) — ABNORMAL HIGH (ref <130)
Total CHOL/HDL Ratio: 3.4 (calc) (ref <5.0)
Triglycerides: 79 mg/dL (ref <150)

## 2019-12-04 LAB — CBC WITH DIFFERENTIAL/PLATELET
Absolute Monocytes: 272 cells/uL (ref 200–950)
Basophils Absolute: 40 cells/uL (ref 0–200)
Basophils Relative: 1 %
Eosinophils Absolute: 52 cells/uL (ref 15–500)
Eosinophils Relative: 1.3 %
HCT: 39.5 % (ref 35.0–45.0)
Hemoglobin: 13 g/dL (ref 11.7–15.5)
Lymphs Abs: 1416 cells/uL (ref 850–3900)
MCH: 29.5 pg (ref 27.0–33.0)
MCHC: 32.9 g/dL (ref 32.0–36.0)
MCV: 89.8 fL (ref 80.0–100.0)
MPV: 10.8 fL (ref 7.5–12.5)
Monocytes Relative: 6.8 %
Neutro Abs: 2220 cells/uL (ref 1500–7800)
Neutrophils Relative %: 55.5 %
Platelets: 273 10*3/uL (ref 140–400)
RBC: 4.4 10*6/uL (ref 3.80–5.10)
RDW: 12.3 % (ref 11.0–15.0)
Total Lymphocyte: 35.4 %
WBC: 4 10*3/uL (ref 3.8–10.8)

## 2019-12-04 MED ORDER — HYDROCHLOROTHIAZIDE 25 MG PO TABS: 25.0000 mg | ORAL_TABLET | Freq: Every day | ORAL | 1 refills | Status: DC

## 2019-12-04 MED ORDER — LISINOPRIL 30 MG PO TABS: 30.0000 mg | ORAL_TABLET | Freq: Every day | ORAL | 1 refills | Status: DC

## 2019-12-04 NOTE — Patient Instructions (Signed)
Preventive Care 82 Years and Older, Female Preventive care refers to lifestyle choices and visits with your health care provider that can promote health and wellness. This includes:  A yearly physical exam. This is also called an annual well check.  Regular dental and eye exams.  Immunizations.  Screening for certain conditions.  Healthy lifestyle choices, such as diet and exercise. What can I expect for my preventive care visit? Physical exam Your health care provider will check:  Height and weight. These may be used to calculate body mass index (BMI), which is a measurement that tells if you are at a healthy weight.  Heart rate and blood pressure.  Your skin for abnormal spots. Counseling Your health care provider may ask you questions about:  Alcohol, tobacco, and drug use.  Emotional well-being.  Home and relationship well-being.  Sexual activity.  Eating habits.  History of falls.  Memory and ability to understand (cognition).  Work and work Statistician.  Pregnancy and menstrual history. What immunizations do I need?  Influenza (flu) vaccine  This is recommended every year. Tetanus, diphtheria, and pertussis (Tdap) vaccine  You may need a Td booster every 10 years. Varicella (chickenpox) vaccine  You may need this vaccine if you have not already been vaccinated. Zoster (shingles) vaccine  You may need this after age 39. Pneumococcal conjugate (PCV13) vaccine  One dose is recommended after age 82. Pneumococcal polysaccharide (PPSV23) vaccine  One dose is recommended after age 13. Measles, mumps, and rubella (MMR) vaccine  You may need at least one dose of MMR if you were born in 1957 or later. You may also need a second dose. Meningococcal conjugate (MenACWY) vaccine  You may need this if you have certain conditions. Hepatitis A vaccine  You may need this if you have certain conditions or if you travel or work in places where you may be exposed  to hepatitis A. Hepatitis B vaccine  You may need this if you have certain conditions or if you travel or work in places where you may be exposed to hepatitis B. Haemophilus influenzae type b (Hib) vaccine  You may need this if you have certain conditions. You may receive vaccines as individual doses or as more than one vaccine together in one shot (combination vaccines). Talk with your health care provider about the risks and benefits of combination vaccines. What tests do I need? Blood tests  Lipid and cholesterol levels. These may be checked every 5 years, or more frequently depending on your overall health.  Hepatitis C test.  Hepatitis B test. Screening  Lung cancer screening. You may have this screening every year starting at age 82 if you have a 30-pack-year history of smoking and currently smoke or have quit within the past 15 years.  Colorectal cancer screening. All adults should have this screening starting at age 82 and continuing until age 4. Your health care provider may recommend screening at age 64 if you are at increased risk. You will have tests every 1-10 years, depending on your results and the type of screening test.  Diabetes screening. This is done by checking your blood sugar (glucose) after you have not eaten for a while (fasting). You may have this done every 1-3 years.  Mammogram. This may be done every 1-2 years. Talk with your health care provider about how often you should have regular mammograms.  BRCA-related cancer screening. This may be done if you have a family history of breast, ovarian, tubal, or peritoneal cancers.  Other tests °· Sexually transmitted disease (STD) testing. °· Bone density scan. This is done to screen for osteoporosis. You may have this done starting at age 65. °Follow these instructions at home: °Eating and drinking °· Eat a diet that includes fresh fruits and vegetables, whole grains, lean protein, and low-fat dairy products. Limit  your intake of foods with high amounts of sugar, saturated fats, and salt. °· Take vitamin and mineral supplements as recommended by your health care provider. °· Do not drink alcohol if your health care provider tells you not to drink. °· If you drink alcohol: °? Limit how much you have to 0-1 drink a day. °? Be aware of how much alcohol is in your drink. In the [place] one drink equals one 12 oz bottle of beer (355 mL), one 5 oz glass of wine (148 mL), or one 1½ oz glass of hard liquor (44 mL). °Lifestyle °· Take daily care of your teeth and gums. °· Stay active. Exercise for at least 30 minutes on 5 or more days each week. °· Do not use any products that contain nicotine or tobacco, such as cigarettes, e-cigarettes, and chewing tobacco. If you need help quitting, ask your health care provider. °· If you are sexually active, practice safe sex. Use a condom or other form of protection in order to prevent STIs (sexually transmitted infections). °· Talk with your health care provider about taking a low-dose aspirin or statin. °What's next? °· Go to your health care provider once a year for a well check visit. °· Ask your health care provider how often you should have your eyes and teeth checked. °· Stay up to date on all vaccines. °This information is not intended to replace advice given to you by your health care provider. Make sure you discuss any questions you have with your health care provider. °Document Revised: 12/22/2017 Document Reviewed: 12/22/2017 °Elsevier Patient Education © 2020 [employer] ° °

## 2019-12-04 NOTE — Progress Notes (Signed)
HPI: Selena Mccarthy is a 84 y.o. female who  has a past medical history of Colon cancer ([place]), DVT, lower extremity ([place]) (1978), Hyperlipidemia, and Hypertension.  she presents to [facility] today, 12/04/19,  for chief complaint of: Annual physical exam  Concerns: None  Upcoming dentist appointment, bridge coming out  Past medical, surgical, social and family history reviewed:  Patient Active Problem List   Diagnosis Date Noted  . History of deep vein thrombosis (DVT) of lower extremity 07/11/2018  . Microscopic hematuria 07/11/2018  . Diuretic-induced hypokalemia 08/17/2017  . Medial epicondylitis of elbow, right 05/04/2017  . Elevated blood pressure reading in office with diagnosis of hypertension 01/06/2017  . Uncontrolled stage 2 hypertension 12/22/2016  . History of colon cancer 12/22/2016  . Family history of stroke or transient ischemic attack in father 12/22/2016  . Hyperlipidemia 10/02/2013  . Essential hypertension 09/14/2013    Past Surgical History:  Procedure Laterality Date  . ABDOMINAL HYSTERECTOMY    . APPENDECTOMY    . COLON SURGERY      Social History   Tobacco Use  . Smoking status: Never Smoker  . Smokeless tobacco: Never Used  Substance Use Topics  . Alcohol use: Yes    Comment: Occasionally    Family History  Problem Relation Age of Onset  . Uterine cancer Mother   . Stroke Father      Current medication list and allergy/intolerance information reviewed:    Current Outpatient Medications  Medication Sig Dispense Refill  . aspirin 81 MG tablet Take 81 mg by mouth daily.    . hydrochlorothiazide (HYDRODIURIL) 25 MG tablet Take 1 tablet (25 mg total) by mouth daily. 90 tablet 1  . lisinopril (ZESTRIL) 30 MG tablet Take 1 tablet (30 mg total) by mouth daily. 90 tablet 1  . potassium chloride SA (K-DUR) 20 MEQ tablet Take 1 tablet (20 mEq total) by mouth every other day. 1 tab PO bid x 2 days, then 1 tab PO  daily 45 tablet 1   No current facility-administered medications for this visit.    Allergies  Allergen Reactions  . Influenza Vaccines Anaphylaxis    Review of Systems:  Constitutional:  No  fever, no chills, No recent illness, No unintentional weight changes. No significant fatigue.   HEENT: No  headache, no vision change, no hearing change, No sore throat, No  sinus pressure  Cardiac: No  chest pain, No  pressure, No palpitations, No  Orthopnea  Respiratory:  No  shortness of breath. No  Cough  Gastrointestinal: No  abdominal pain, No  nausea, No  vomiting,  No  blood in stool, No  diarrhea, No  constipation   Musculoskeletal: No new myalgia/arthralgia  Skin: No  Rash, No other wounds/concerning lesions  Genitourinary: No  incontinence, No  abnormal genital bleeding, No abnormal genital discharge  Hem/Onc: No  easy bruising/bleeding, No  abnormal lymph node  Endocrine: No cold intolerance,  No heat intolerance. No polyuria/polydipsia/polyphagia   Neurologic: No  weakness, No  dizziness, No  slurred speech/focal weakness/facial droop  Psychiatric: No  concerns with depression, No  concerns with anxiety, No sleep problems, No mood problems  Exam:  BP (!) 153/94   Pulse 69   Temp 97.9 F (36.6 C) (Oral)   Ht '5\' 3"'  (1.6 m)   Wt 126 lb (57.2 kg)   BMI 22.32 kg/m   Constitutional: VS see above. General Appearance: alert, well-developed, well-nourished, NAD  Eyes: Normal lids  and conjunctive, non-icteric sclera  Ears, Nose, Mouth, Throat: MMM, Normal external inspection ears/nares/mouth/lips/gums. TM normal bilaterally.   Neck: No masses, trachea midline. No thyroid enlargement. No tenderness/mass appreciated. No lymphadenopathy  Respiratory: Normal respiratory effort. no wheeze, no rhonchi, no rales  Cardiovascular: S1/S2 normal, no murmur, no rub/gallop auscultated. RRR. No lower extremity edema. Pedal pulse II/IV bilaterally PT. No carotid bruit or JVD. No  abdominal aortic bruit.  Gastrointestinal: Nontender, no masses. No hepatomegaly, no splenomegaly. No hernia appreciated. Bowel sounds normal. Rectal exam deferred.   Musculoskeletal: Gait normal. No clubbing/cyanosis of digits.   Neurological: Normal balance/coordination. No tremor. No cranial nerve deficit on limited exam. Motor and sensation intact and symmetric. Cerebellar reflexes intact.   Skin: warm, dry, intact. No rash/ulcer. No concerning nevi or subq nodules on limited exam.    Psychiatric: Normal judgment/insight. Normal mood and affect. Oriented x3.    Results for orders placed or performed in visit on 12/03/19 (from the past 72 hour(s))  Lipid Panel w/reflex Direct LDL     Status: Abnormal   Collection Time: 12/03/19 10:42 AM  Result Value Ref Range   Cholesterol 217 (H) <200 mg/dL   HDL 64 > OR = 50 mg/dL   Triglycerides 79 <150 mg/dL   LDL Cholesterol (Calc) 135 (H) mg/dL (calc)    Comment: Reference range: <100 . Desirable range <100 mg/dL for primary prevention;   <70 mg/dL for patients with CHD or diabetic patients  with > or = 2 CHD risk factors. Marland Kitchen LDL-C is now calculated using the Martin-Hopkins  calculation, which is a validated novel method providing  better accuracy than the Friedewald equation in the  estimation of LDL-C.  Cresenciano Genre et al. Annamaria Helling. 0160;109(32): 2061-2068  (http://education.[employer].com/faq/FAQ164)    Total CHOL/HDL Ratio 3.4 <5.0 (calc)   Non-HDL Cholesterol (Calc) 153 (H) <130 mg/dL (calc)    Comment: For patients with diabetes plus 1 major ASCVD risk  factor, treating to a non-HDL-C goal of <100 mg/dL  (LDL-C of <70 mg/dL) is considered a therapeutic  option.   CBC w/Diff/Platelet     Status: None   Collection Time: 12/03/19 10:42 AM  Result Value Ref Range   WBC 4.0 3.8 - 10.8 Thousand/uL   RBC 4.40 3.80 - 5.10 Million/uL   Hemoglobin 13.0 11.7 - 15.5 g/dL   HCT 39.5 35 - 45 %   MCV 89.8 80.0 - 100.0 fL   MCH 29.5  27.0 - 33.0 pg   MCHC 32.9 32.0 - 36.0 g/dL   RDW 12.3 11.0 - 15.0 %   Platelets 273 140 - 400 Thousand/uL   MPV 10.8 7.5 - 12.5 fL   Neutro Abs 2,220 1,500 - 7,800 cells/uL   Lymphs Abs 1,416 850 - 3,900 cells/uL   Absolute Monocytes 272 200 - 950 cells/uL   Eosinophils Absolute 52 15.0 - 500.0 cells/uL   Basophils Absolute 40 0.0 - 200.0 cells/uL   Neutrophils Relative % 55.5 %   Total Lymphocyte 35.4 %   Monocytes Relative 6.8 %   Eosinophils Relative 1.3 %   Basophils Relative 1.0 %  COMPLETE METABOLIC PANEL WITH GFR     Status: Abnormal   Collection Time: 12/03/19 10:42 AM  Result Value Ref Range   Glucose, Bld 101 (H) 65 - 99 mg/dL    Comment: .            Fasting reference interval . For someone without known diabetes, a glucose value between 100 and 125 mg/dL is consistent  with prediabetes and should be confirmed with a follow-up test. .    BUN 14 7 - 25 mg/dL   Creat 0.74 0.60 - 0.88 mg/dL    Comment: For patients >67 years of age, the reference limit for Creatinine is approximately 13% higher for people identified as African-American. .    GFR, Est Non African American 75 > OR = 60 mL/min/1.55m   GFR, Est African American 87 > OR = 60 mL/min/1.776m  BUN/Creatinine Ratio NOT APPLICABLE 6 - 22 (calc)   Sodium 144 135 - 146 mmol/L   Potassium 4.2 3.5 - 5.3 mmol/L   Chloride 103 98 - 110 mmol/L   CO2 27 20 - 32 mmol/L   Calcium 10.3 8.6 - 10.4 mg/dL   Total Protein 7.8 6.1 - 8.1 g/dL   Albumin 4.5 3.6 - 5.1 g/dL   Globulin 3.3 1.9 - 3.7 g/dL (calc)   AG Ratio 1.4 1.0 - 2.5 (calc)   Total Bilirubin 0.5 0.2 - 1.2 mg/dL   Alkaline phosphatase (APISO) 55 37 - 153 U/L   AST 16 10 - 35 U/L   ALT 7 6 - 29 U/L    No results found.   ASSESSMENT/PLAN:   1. Annual physical exam Reviewed lab results with patient.  Preventative care information provided with AVS.  2. Essential hypertension Restart lisinopril 30 mg daily.  Continue hydrochlorothiazide 25 mg  daily.  Monitor blood pressure at home. - lisinopril (ZESTRIL) 30 MG tablet; Take 1 tablet (30 mg total) by mouth daily.  Dispense: 90 tablet; Refill: 1 - hydrochlorothiazide (HYDRODIURIL) 25 MG tablet; Take 1 tablet (25 mg total) by mouth daily.  Dispense: 90 tablet; Refill: 1  3. Mixed hyperlipidemia Cholesterol is elevated.  Unable to tolerate statins.  Recommend dietary modifications for a low-fat diet.  No orders of the defined types were placed in this encounter.   Meds ordered this encounter  Medications  . lisinopril (ZESTRIL) 30 MG tablet    Sig: Take 1 tablet (30 mg total) by mouth daily.    Dispense:  90 tablet    Refill:  1    Order Specific Question:   Supervising Provider    Answer:   ALEmeterio [id number]. hydrochlorothiazide (HYDRODIURIL) 25 MG tablet    Sig: Take 1 tablet (25 mg total) by mouth daily.    Dispense:  90 tablet    Refill:  1    No refills. Pt is overdue for an annual.    Order Specific Question:   Supervising Provider    Answer:   ALEmeterio Reeve1[[id number]]  Patient Instructions  Preventive Care 6541ears and Older, Female Preventive care refers to lifestyle choices and visits with your health care provider that can promote health and wellness. This includes:  A yearly physical exam. This is also called an annual well check.  Regular dental and eye exams.  Immunizations.  Screening for certain conditions.  Healthy lifestyle choices, such as diet and exercise. What can I expect for my preventive care visit? Physical exam Your health care provider will check:  Height and weight. These may be used to calculate body mass index (BMI), which is a measurement that tells if you are at a healthy weight.  Heart rate and blood pressure.  Your skin for abnormal spots. Counseling Your health care provider may ask you questions about:  Alcohol, tobacco, and drug use.  Emotional well-being.  Home and relationship  well-being.  Sexual activity.  Eating  habits.  History of falls.  Memory and ability to understand (cognition).  Work and work Statistician.  Pregnancy and menstrual history. What immunizations do I need?  Influenza (flu) vaccine  This is recommended every year. Tetanus, diphtheria, and pertussis (Tdap) vaccine  You may need a Td booster every 10 years. Varicella (chickenpox) vaccine  You may need this vaccine if you have not already been vaccinated. Zoster (shingles) vaccine  You may need this after age 71. Pneumococcal conjugate (PCV13) vaccine  One dose is recommended after age 83. Pneumococcal polysaccharide (PPSV23) vaccine  One dose is recommended after age 52. Measles, mumps, and rubella (MMR) vaccine  You may need at least one dose of MMR if you were born in 1957 or later. You may also need a second dose. Meningococcal conjugate (MenACWY) vaccine  You may need this if you have certain conditions. Hepatitis A vaccine  You may need this if you have certain conditions or if you travel or work in places where you may be exposed to hepatitis A. Hepatitis B vaccine  You may need this if you have certain conditions or if you travel or work in places where you may be exposed to hepatitis B. Haemophilus influenzae type b (Hib) vaccine  You may need this if you have certain conditions. You may receive vaccines as individual doses or as more than one vaccine together in one shot (combination vaccines). Talk with your health care provider about the risks and benefits of combination vaccines. What tests do I need? Blood tests  Lipid and cholesterol levels. These may be checked every 5 years, or more frequently depending on your overall health.  Hepatitis C test.  Hepatitis B test. Screening  Lung cancer screening. You may have this screening every year starting at age 98 if you have a 30-pack-year history of smoking and currently smoke or have quit within the past  15 years.  Colorectal cancer screening. All adults should have this screening starting at age 89 and continuing until age 95. Your health care provider may recommend screening at age 75 if you are at increased risk. You will have tests every 1-10 years, depending on your results and the type of screening test.  Diabetes screening. This is done by checking your blood sugar (glucose) after you have not eaten for a while (fasting). You may have this done every 1-3 years.  Mammogram. This may be done every 1-2 years. Talk with your health care provider about how often you should have regular mammograms.  BRCA-related cancer screening. This may be done if you have a family history of breast, ovarian, tubal, or peritoneal cancers. Other tests  Sexually transmitted disease (STD) testing.  Bone density scan. This is done to screen for osteoporosis. You may have this done starting at age 46. Follow these instructions at home: Eating and drinking  Eat a diet that includes fresh fruits and vegetables, whole grains, lean protein, and low-fat dairy products. Limit your intake of foods with high amounts of sugar, saturated fats, and salt.  Take vitamin and mineral supplements as recommended by your health care provider.  Do not drink alcohol if your health care provider tells you not to drink.  If you drink alcohol: ? Limit how much you have to 0-1 drink a day. ? Be aware of how much alcohol is in your drink. In the [place] one drink equals one 12 oz bottle of beer (355 mL), one 5 oz glass of wine (148 mL), or one 1  oz glass of hard liquor (44 mL). Lifestyle  Take daily care of your teeth and gums.  Stay active. Exercise for at least 30 minutes on 5 or more days each week.  Do not use any products that contain nicotine or tobacco, such as cigarettes, e-cigarettes, and chewing tobacco. If you need help quitting, ask your health care provider.  If you are sexually active, practice safe sex. Use a  condom or other form of protection in order to prevent STIs (sexually transmitted infections).  Talk with your health care provider about taking a low-dose aspirin or statin. What's next?  Go to your health care provider once a year for a well check visit.  Ask your health care provider how often you should have your eyes and teeth checked.  Stay up to date on all vaccines. This information is not intended to replace advice given to you by your health care provider. Make sure you discuss any questions you have with your health care provider. Document Revised: 12/22/2017 Document Reviewed: 12/22/2017 Elsevier Patient Education  [place].  Follow-up plan: Return in about 2 weeks (around 12/18/2019) for nurse visit for BP check then follow up per PCP.  Clearnce Sorrel, DNP, APRN, FNP-BC [facility]

## 2019-12-18 ENCOUNTER — Ambulatory Visit (INDEPENDENT_AMBULATORY_CARE_PROVIDER_SITE_OTHER): Payer: Medicare Other | Admitting: Medical-Surgical

## 2019-12-18 ENCOUNTER — Other Ambulatory Visit: Payer: Self-pay

## 2019-12-18 VITALS — BP 130/78 | HR 71

## 2019-12-18 DIAGNOSIS — I10 Essential (primary) hypertension: Secondary | ICD-10-CM | POA: Diagnosis not present

## 2019-12-18 NOTE — Progress Notes (Signed)
Established Patient Office Visit  Subjective:  Patient ID: Selena Mccarthy, female    DOB: 05-Apr-1935  Age: 84 y.o. MRN: [id number]  CC:  Chief Complaint  Patient presents with  . Hypertension    HPI [name] presents for blood pressure check. Denies chest pain, shortness of breath or dizziness.   Past Medical History:  Diagnosis Date  . Colon cancer ([place])   . DVT, lower extremity ([place]) 1978   Left  . Hyperlipidemia   . Hypertension     Past Surgical History:  Procedure Laterality Date  . ABDOMINAL HYSTERECTOMY    . APPENDECTOMY    . COLON SURGERY      Family History  Problem Relation Age of Onset  . Uterine cancer Mother   . Stroke Father     Social History   Socioeconomic History  . Marital status: Single    Spouse name: Not on file  . Number of children: 4  . Years of education: 25  . Highest education level: 12th grade  Occupational History  . Occupation: Scientist, product/process development    Comment: retired  Tobacco Use  . Smoking status: Never Smoker  . Smokeless tobacco: Never Used  Vaping Use  . Vaping Use: Never used  Substance and Sexual Activity  . Alcohol use: Yes    Comment: Occasionally  . Drug use: No  . Sexual activity: Not Currently    Birth control/protection: Surgical    Comment: Hysterectomy  Other Topics Concern  . Not on file  Social History Narrative   Walks daily 2 times a day   Social Determinants of Health   Financial Resource Strain:   . Difficulty of Paying Living Expenses: Not on file  Food Insecurity:   . Worried About Charity fundraiser in the Last Year: Not on file  . Ran Out of Food in the Last Year: Not on file  Transportation Needs:   . Lack of Transportation (Medical): Not on file  . Lack of Transportation (Non-Medical): Not on file  Physical Activity:   . Days of Exercise per Week: Not on file  . Minutes of Exercise per Session: Not on file  Stress:   . Feeling of Stress : Not on file  Social Connections:   .  Frequency of Communication with Friends and Family: Not on file  . Frequency of Social Gatherings with Friends and Family: Not on file  . Attends Religious Services: Not on file  . Active Member of Clubs or Organizations: Not on file  . Attends Archivist Meetings: Not on file  . Marital Status: Not on file  Intimate Partner Violence:   . Fear of Current or Ex-Partner: Not on file  . Emotionally Abused: Not on file  . Physically Abused: Not on file  . Sexually Abused: Not on file    Outpatient Medications Prior to Visit  Medication Sig Dispense Refill  . aspirin 81 MG tablet Take 81 mg by mouth daily.    . hydrochlorothiazide (HYDRODIURIL) 25 MG tablet Take 1 tablet (25 mg total) by mouth daily. 90 tablet 1  . lisinopril (ZESTRIL) 30 MG tablet Take 1 tablet (30 mg total) by mouth daily. 90 tablet 1  . potassium chloride SA (K-DUR) 20 MEQ tablet Take 1 tablet (20 mEq total) by mouth every other day. 1 tab PO bid x 2 days, then 1 tab PO daily 45 tablet 1   No facility-administered medications prior to visit.    Allergies  Allergen Reactions  . Influenza Vaccines Anaphylaxis    ROS Review of Systems    Objective:    Physical Exam  BP 130/78   Pulse 71   SpO2 100%  Wt Readings from Last 3 Encounters:  12/04/19 126 lb (57.2 kg)  11/30/19 125 lb 14.4 oz (57.1 kg)  10/27/18 126 lb 11.2 oz (57.5 kg)     There are no preventive care reminders to display for this patient.  There are no preventive care reminders to display for this patient.  No results found for: TSH Lab Results  Component Value Date   WBC 4.0 12/03/2019   HGB 13.0 12/03/2019   HCT 39.5 12/03/2019   MCV 89.8 12/03/2019   PLT 273 12/03/2019   Lab Results  Component Value Date   NA 144 12/03/2019   K 4.2 12/03/2019   CO2 27 12/03/2019   GLUCOSE 101 (H) 12/03/2019   BUN 14 12/03/2019   CREATININE 0.74 12/03/2019   BILITOT 0.5 12/03/2019   AST 16 12/03/2019   ALT 7 12/03/2019   PROT  7.8 12/03/2019   CALCIUM 10.3 12/03/2019   Lab Results  Component Value Date   CHOL 217 (H) 12/03/2019   Lab Results  Component Value Date   HDL 64 12/03/2019   Lab Results  Component Value Date   LDLCALC 135 (H) 12/03/2019   Lab Results  Component Value Date   TRIG 79 12/03/2019   Lab Results  Component Value Date   CHOLHDL 3.4 12/03/2019   No results found for: HGBA1C    Assessment & Plan:  HTN- Blood pressure within normal range at the second check. Patient advised to continue current medications and follow up with Dr Sheppard Coil in 3 months.    Problem List Items Addressed This Visit    Essential hypertension - Primary      No orders of the defined types were placed in this encounter.   Follow-up: Return in about 3 months (around 03/17/2020) for PCP for HTN. Selena Mccarthy, Selena Mccarthy, Selena Mccarthy

## 2020-03-17 ENCOUNTER — Encounter: Payer: Self-pay | Admitting: Osteopathic Medicine

## 2020-03-17 ENCOUNTER — Other Ambulatory Visit: Payer: Self-pay

## 2020-03-17 ENCOUNTER — Ambulatory Visit (INDEPENDENT_AMBULATORY_CARE_PROVIDER_SITE_OTHER): Payer: Medicare Other | Admitting: Osteopathic Medicine

## 2020-03-17 VITALS — BP 127/79 | HR 63 | Temp 97.2°F | Wt 123.1 lb

## 2020-03-17 DIAGNOSIS — I10 Essential (primary) hypertension: Secondary | ICD-10-CM

## 2020-03-17 DIAGNOSIS — E782 Mixed hyperlipidemia: Secondary | ICD-10-CM | POA: Diagnosis not present

## 2020-03-17 MED ORDER — HYDROCHLOROTHIAZIDE 25 MG PO TABS: 25.0000 mg | ORAL_TABLET | Freq: Every day | ORAL | 3 refills | Status: DC

## 2020-03-17 MED ORDER — LISINOPRIL 30 MG PO TABS: 30.0000 mg | ORAL_TABLET | Freq: Every day | ORAL | 3 refills | Status: DC

## 2020-03-17 NOTE — Progress Notes (Signed)
Selena Mccarthy is a 85 y.o. female who presents to  [place] [facility]  today, 03/17/20, seeking care for the following:  . Follow-up BP and cholesterol o Reviewed notes from her most recent visit w/ Samuel Bouche NP in this office 11/2019 (I haven't seen pt since 10/2018).  o BP at goal o Pt has no complaints     ASSESSMENT & PLAN with other pertinent findings:  The primary encounter diagnosis was Mixed hyperlipidemia. A diagnosis of Essential hypertension was also pertinent to this visit.   --> refiled Rx   There are no Patient Instructions on file for this visit.  No orders of the defined types were placed in this encounter.   Meds ordered this encounter  Medications  . hydrochlorothiazide (HYDRODIURIL) 25 MG tablet    Sig: Take 1 tablet (25 mg total) by mouth daily.    Dispense:  90 tablet    Refill:  3  . lisinopril (ZESTRIL) 30 MG tablet    Sig: Take 1 tablet (30 mg total) by mouth daily.    Dispense:  90 tablet    Refill:  3     See below for relevant physical exam findings  See below for recent lab and imaging results reviewed  Medications, allergies, PMH, PSH, SocH, [place] reviewed below    Follow-up instructions: Return for [place] 11/2020, ANNUAL W/ DR [place] AFTER THAT VISIT .                                        Exam:  BP 127/79 (BP Location: Left Arm, Patient Position: Sitting, Cuff Size: Normal)   Pulse 63   Temp (!) 97.2 F (36.2 C) (Oral)   Wt 123 lb 1.3 oz (55.8 kg)   BMI 21.80 kg/m   Constitutional: VS see above. General Appearance: alert, well-developed, well-nourished, NAD  Neck: No masses, trachea midline.   Respiratory: Normal respiratory effort. no wheeze, no rhonchi, no rales  Cardiovascular: S1/S2 normal, no murmur, no rub/gallop auscultated. RRR.   Musculoskeletal: Gait normal. Symmetric and independent movement of  all extremities  Neurological: Normal balance/coordination. No tremor.  Skin: warm, dry, intact.   Psychiatric: Normal judgment/insight. Normal mood and affect. Oriented x3.   Current Meds  Medication Sig  . aspirin 81 MG tablet Take 81 mg by mouth daily.  . potassium chloride SA (K-DUR) 20 MEQ tablet Take 1 tablet (20 mEq total) by mouth every other day. 1 tab PO bid x 2 days, then 1 tab PO daily  . [DISCONTINUED] hydrochlorothiazide (HYDRODIURIL) 25 MG tablet Take 1 tablet (25 mg total) by mouth daily.  . [DISCONTINUED] lisinopril (ZESTRIL) 30 MG tablet Take 1 tablet (30 mg total) by mouth daily.    Allergies  Allergen Reactions  . Influenza Vaccines Anaphylaxis    Patient Active Problem List   Diagnosis Date Noted  . History of deep vein thrombosis (DVT) of lower extremity 07/11/2018  . Microscopic hematuria 07/11/2018  . Diuretic-induced hypokalemia 08/17/2017  . Medial epicondylitis of elbow, right 05/04/2017  . Elevated blood pressure reading in office with diagnosis of hypertension 01/06/2017  . Uncontrolled stage 2 hypertension 12/22/2016  . History of colon cancer 12/22/2016  . Family history of stroke or transient ischemic attack in father 12/22/2016  . Hyperlipidemia 10/02/2013  . Essential hypertension 09/14/2013  Family History  Problem Relation Age of Onset  . Uterine cancer Mother   . Stroke Father     Social History   Tobacco Use  Smoking Status Never Smoker  Smokeless Tobacco Never Used    Past Surgical History:  Procedure Laterality Date  . ABDOMINAL HYSTERECTOMY    . APPENDECTOMY    . COLON SURGERY      Immunization History  Administered Date(s) Administered  . PFIZER(Purple Top)SARS-COV-2 Vaccination 05/30/2019, 06/27/2019  . Pneumococcal Conjugate-13 09/14/2013  . Pneumococcal Polysaccharide-23 05/04/2017    No results found for this or any previous visit (from the past 2160 hour(s)).  No results found.     All questions at  time of visit were answered - patient instructed to contact office with any additional concerns or updates. ER/RTC precautions were reviewed with the patient as applicable.   Please note: manual typing as well as voice recognition software may have been used to produce this document - typos may escape review. Please contact Dr. Sheppard Coil for any needed clarifications.

## 2021-02-18 ENCOUNTER — Other Ambulatory Visit: Payer: Self-pay

## 2021-02-18 ENCOUNTER — Ambulatory Visit (INDEPENDENT_AMBULATORY_CARE_PROVIDER_SITE_OTHER): Payer: Medicare Other | Admitting: Family Medicine

## 2021-02-18 VITALS — BP 138/81 | HR 69 | Ht 63.5 in | Wt 124.1 lb

## 2021-02-18 DIAGNOSIS — Z0000 Encounter for general adult medical examination without abnormal findings: Secondary | ICD-10-CM

## 2021-02-18 NOTE — Patient Instructions (Addendum)
[place] Maintenance Summary and Written Plan of Care  [name] ,  Thank you for allowing me to perform your Medicare Annual Wellness Visit and for your ongoing commitment to your health.   Health Maintenance & Immunization History Health Maintenance  Topic Date Due   COVID-19 Vaccine (3 - Booster for Pfizer series) 03/06/2021 (Originally 08/22/2019)   Zoster Vaccines- Shingrix (1 of 2) 05/18/2021 (Originally 11/18/1987)   DEXA SCAN  02/18/2022 (Originally 10/17/2020)   TETANUS/TDAP  10/27/2022 (Originally 11/17/1954)   Pneumonia Vaccine 13+ Years old  Completed   HPV VACCINES  Aged Out   Immunization History  Administered Date(s) Administered   PFIZER(Purple Top)SARS-COV-2 Vaccination 05/30/2019, 06/27/2019   Pneumococcal Conjugate-13 09/14/2013   Pneumococcal Polysaccharide-23 05/04/2017    These are the patient goals that we discussed:  Goals Addressed               This Visit's Progress     Patient Stated (pt-stated)        Stay healthy and active.         This is a list of Health Maintenance Items that are overdue or due now: Td vaccine Bone densitometry screening Shingrix    Orders/Referrals Placed Today: No orders of the defined types were placed in this encounter.  (Contact our referral department at [phone] if you have not spoken with someone about your referral appointment within the next 5 days)    Follow-up Plan Follow-up with your new PCP as planned Schedule your shingrix vaccine and tetanus vaccine at your pharmacy. Let us know if you would like for [place] to send the bone density referral. Medicare wellness visit in one year.  AVS printed and given to the patient.     Health Maintenance, Female Adopting a healthy lifestyle and getting preventive care are important in promoting health and wellness. Ask your health care provider about: The right schedule for you to have regular tests and exams. Things you can do  on your own to prevent diseases and keep yourself healthy. What should I know about diet, weight, and exercise? Eat a healthy diet  Eat a diet that includes plenty of vegetables, fruits, low-fat dairy products, and lean protein. Do not eat a lot of foods that are high in solid fats, added sugars, or sodium. Maintain a healthy weight Body mass index (BMI) is used to identify weight problems. It estimates body fat based on height and weight. Your health care provider can help determine your BMI and help you achieve or maintain a healthy weight. Get regular exercise Get regular exercise. This is one of the most important things you can do for your health. Most adults should: Exercise for at least 150 minutes each week. The exercise should increase your heart rate and make you sweat (moderate-intensity exercise). Do strengthening exercises at least twice a week. This is in addition to the moderate-intensity exercise. Spend less time sitting. Even light physical activity can be beneficial. Watch cholesterol and blood lipids Have your blood tested for lipids and cholesterol at 84 years of age, then have this test every 5 years. Have your cholesterol levels checked more often if: Your lipid or cholesterol levels are high. You are older than 84 years of age. You are at high risk for heart disease. What should I know about cancer screening? Depending on your health history and family history, you may need to have cancer screening at various ages. This may include screening for: Breast cancer. Cervical cancer.  Colorectal cancer. Skin cancer. Lung cancer. What should I know about heart disease, diabetes, and high blood pressure? Blood pressure and heart disease High blood pressure causes heart disease and increases the risk of stroke. This is more likely to develop in people who have high blood pressure readings or are overweight. Have your blood pressure checked: Every 3-5 years if you are 58-58  years of age. Every year if you are 59 years old or older. Diabetes Have regular diabetes screenings. This checks your fasting blood sugar level. Have the screening done: Once every three years after age 67 if you are at a normal weight and have a low risk for diabetes. More often and at a younger age if you are overweight or have a high risk for diabetes. What should I know about preventing infection? Hepatitis B If you have a higher risk for hepatitis B, you should be screened for this virus. Talk with your health care provider to find out if you are at risk for hepatitis B infection. Hepatitis C Testing is recommended for: Everyone born from [birth year] through 1963. Anyone with known risk factors for hepatitis C. Sexually transmitted infections (STIs) Get screened for STIs, including gonorrhea and chlamydia, if: You are sexually active and are younger than 84 years of age. You are older than 84 years of age and your health care provider tells you that you are at risk for this type of infection. Your sexual activity has changed since you were last screened, and you are at increased risk for chlamydia or gonorrhea. Ask your health care provider if you are at risk. Ask your health care provider about whether you are at high risk for HIV. Your health care provider may recommend a prescription medicine to help prevent HIV infection. If you choose to take medicine to prevent HIV, you should first get tested for HIV. You should then be tested every 3 months for as long as you are taking the medicine. Pregnancy If you are about to stop having your period (premenopausal) and you may become pregnant, seek counseling before you get pregnant. Take 400 to 800 micrograms (mcg) of folic acid every day if you become pregnant. Ask for birth control (contraception) if you want to prevent pregnancy. Osteoporosis and menopause Osteoporosis is a disease in which the bones lose minerals and strength with aging. This  can result in bone fractures. If you are 62 years old or older, or if you are at risk for osteoporosis and fractures, ask your health care provider if you should: Be screened for bone loss. Take a calcium or vitamin D supplement to lower your risk of fractures. Be given hormone replacement therapy (HRT) to treat symptoms of menopause. Follow these instructions at home: Alcohol use Do not drink alcohol if: Your health care provider tells you not to drink. You are pregnant, may be pregnant, or are planning to become pregnant. If you drink alcohol: Limit how much you have to: 0-1 drink a day. Know how much alcohol is in your drink. In the [place] one drink equals one 12 oz bottle of beer (355 mL), one 5 oz glass of wine (148 mL), or one 1 oz glass of hard liquor (44 mL). Lifestyle Do not use any products that contain nicotine or tobacco. These products include cigarettes, chewing tobacco, and vaping devices, such as e-cigarettes. If you need help quitting, ask your health care provider. Do not use street drugs. Do not share needles. Ask your health care provider for help  if you need support or information about quitting drugs. General instructions Schedule regular health, dental, and eye exams. Stay current with your vaccines. Tell your health care provider if: You often feel depressed. You have ever been abused or do not feel safe at home. Summary Adopting a healthy lifestyle and getting preventive care are important in promoting health and wellness. Follow your health care provider's instructions about healthy diet, exercising, and getting tested or screened for diseases. Follow your health care provider's instructions on monitoring your cholesterol and blood pressure. This information is not intended to replace advice given to you by your health care provider. Make sure you discuss any questions you have with your health care provider. Document Revised: 05/19/2020 Document Reviewed:  05/19/2020 Elsevier Patient Education  [place].

## 2021-02-18 NOTE — Progress Notes (Signed)
MEDICARE ANNUAL WELLNESS VISIT  02/18/2021  Subjective:  Selena Mccarthy is a 86 y.o. female patient of [name], DO who had a Medicare Annual Wellness Visit today. Gerene is Retired and lives with their daughter. she has 4 children. she reports that she is socially active and does interact with friends/family regularly. she is minimally physically active and enjoys going to the casino.  Patient Care Team: [name], DO as PCP - General (Osteopathic Medicine)  Advanced Directives 02/18/2021 11/30/2019 10/09/2018  Does Patient Have a Medical Advance Directive? No No No  Would patient like information on creating a medical advance directive? No - Patient declined Yes (MAU/Ambulatory/Procedural Areas - Information given) No - Patient declined    Hospital Utilization Over the Past 12 Months: # of hospitalizations or ER visits: 0 # of surgeries: 0  Review of Systems    Patient reports that her overall health is unchanged when compared to last year.  Review of Systems: History obtained from chart review and the patient  All other systems negative.  Pain Assessment Pain : No/denies pain     Current Medications & Allergies (verified) Allergies as of 02/18/2021       Reactions   Influenza Vaccines Anaphylaxis        Medication List        Accurate as of February 18, 2021 11:14 AM. If you have any questions, ask your nurse or doctor.          aspirin 81 MG tablet Take 81 mg by mouth daily.   hydrochlorothiazide 25 MG tablet Commonly known as: HYDRODIURIL Take 1 tablet (25 mg total) by mouth daily.   lisinopril 30 MG tablet Commonly known as: ZESTRIL Take 1 tablet (30 mg total) by mouth daily.   potassium chloride SA 20 MEQ tablet Commonly known as: KLOR-CON M Take 1 tablet (20 mEq total) by mouth every other day. 1 tab PO bid x 2 days, then 1 tab PO daily        History (reviewed): Past Medical History:  Diagnosis Date   Colon cancer ([place])     DVT, lower extremity ([place]) 1978   Left   Hyperlipidemia    Hypertension    Past Surgical History:  Procedure Laterality Date   ABDOMINAL HYSTERECTOMY     APPENDECTOMY     COLON SURGERY     Family History  Problem Relation Age of Onset   Uterine cancer Mother    Stroke Father    Social History   Socioeconomic History   Marital status: Single    Spouse name: Not on file   Number of children: 4   Years of education: 12   Highest education level: 12th grade  Occupational History   Occupation: Scientist, product/process development    Comment: retired  Tobacco Use   Smoking status: Never   Smokeless tobacco: Never  Scientific laboratory technician Use: Never used  Substance and Sexual Activity   Alcohol use: Yes    Comment: Occasionally   Drug use: No   Sexual activity: Not Currently    Birth control/protection: Surgical    Comment: Hysterectomy  Other Topics Concern   Not on file  Social History Narrative   Lives with her daughter and granddaughter. She has four children. She enjoys going to the casino.   Social Determinants of Health   Financial Resource Strain: Low Risk    Difficulty of Paying Living Expenses: Not hard at all  Food Insecurity: No Food Insecurity  Worried About Charity fundraiser in the Last Year: Never true   [place] in the Last Year: Never true  Transportation Needs: No Transportation Needs   Lack of Transportation (Medical): No   Lack of Transportation (Non-Medical): No  Physical Activity: Inactive   Days of Exercise per Week: 0 days   Minutes of Exercise per Session: 0 min  Stress: No Stress Concern Present   Feeling of Stress : Not at all  Social Connections: Moderately Isolated   Frequency of Communication with Friends and Family: Twice a week   Frequency of Social Gatherings with Friends and Family: More than three times a week   Attends Religious Services: More than 4 times per year   Active Member of [employer] or Organizations: No   Attends Theatre manager Meetings: Never   Marital Status: Widowed    Activities of Daily Living In your present state of health, do you have any difficulty performing the following activities: 02/18/2021  Hearing? N  Vision? N  Difficulty concentrating or making decisions? N  Walking or climbing stairs? N  Dressing or bathing? N  Doing errands, shopping? N  Preparing Food and eating ? N  Using the Toilet? N  In the past six months, have you accidently leaked urine? N  Do you have problems with loss of bowel control? N  Managing your Medications? N  Managing your Finances? N  Housekeeping or managing your Housekeeping? N  Some recent data might be hidden    Patient Education/Literacy How often do you need to have someone help you when you read instructions, pamphlets, or other written materials from your doctor or pharmacy?: 1 - Never What is the last grade level you completed in school?: 12th grade  Exercise Current Exercise Habits: Home exercise routine, Type of exercise: walking, Time (Minutes): 30, Frequency (Times/Week): 2, Weekly Exercise (Minutes/Week): 60, Intensity: Mild, Exercise limited by: None identified  Diet Patient reports consuming 2 meals a day and 0 snack(s) a day Patient reports that her primary diet is: Regular Patient reports that she does have regular access to food.   Depression Screen PHQ 2/9 Scores 02/18/2021 11/30/2019 10/09/2018 07/11/2018 08/03/2017 12/22/2016  PHQ - 2 Score 0 0 0 0 0 0     Fall Risk Fall Risk  02/18/2021 11/30/2019 10/09/2018 08/03/2017  Falls in the past year? 0 0 0 No  Number falls in past yr: 0 0 0 -  Injury with Fall? 0 0 0 -  Risk for fall due to : No Fall Risks - - -  Follow up Falls evaluation completed;Education provided Falls evaluation completed Falls prevention discussed -     Objective:   Ht 5' 3.5" (1.613 m)    Wt 124 lb 1.9 oz (56.3 kg)    BMI 21.64 kg/m   Last Weight  Most recent update: 02/18/2021 10:59 AM    Weight  56.3 kg  (124 lb 1.9 oz)             Body mass index is 21.64 kg/m.  Hearing/Vision  [place] did not have difficulty with hearing/understanding during the face-to-face interview Elle[place] did not have difficulty with her vision during the face-to-face interview Reports that she has not had a formal eye exam by an eye care professional within the past year Reports that she has not had a formal hearing evaluation within the past year  Cognitive Function: 6CIT Screen 02/18/2021 11/30/2019 10/09/2018 08/03/2017  What Year? 0 points 0 points  0 points 0 points  What month? 0 points 0 points 0 points 0 points  What time? 0 points 0 points 0 points 0 points  Count back from 20 0 points 0 points 0 points 0 points  Months in reverse 0 points 0 points 0 points 0 points  Repeat phrase 0 points 0 points 0 points 6 points  Total Score 0 0 0 6    Normal Cognitive Function Screening: Yes (Normal:0-7, Significant for Dysfunction: >8)  Immunization & Health Maintenance Record Immunization History  Administered Date(s) Administered   PFIZER(Purple Top)SARS-COV-2 Vaccination 05/30/2019, 06/27/2019   Pneumococcal Conjugate-13 09/14/2013   Pneumococcal Polysaccharide-23 05/04/2017    Health Maintenance  Topic Date Due   COVID-19 Vaccine (3 - Booster for [place] series) 03/06/2021 (Originally 08/22/2019)   Zoster Vaccines- Shingrix (1 of 2) 05/18/2021 (Originally 11/18/1987)   DEXA SCAN  02/18/2022 (Originally 10/17/2020)   TETANUS/TDAP  10/27/2022 (Originally 11/17/1956)   Pneumonia Vaccine 12+ Years old  Completed   HPV VACCINES  Aged Out       Assessment  This is a routine wellness examination for Remus Blake.  Health Maintenance: Due or Overdue There are no preventive care reminders to display for this patient.   Remus Blake does not need a referral for Community Assistance: Care Management:   no Social Work:    no Prescription Assistance:  no Nutrition/Diabetes Education:  no   Plan:   Personalized Goals  Goals Addressed               This Visit's Progress     Patient Stated (pt-stated)        Stay healthy and active.       Personalized Health Maintenance & Screening Recommendations  Td vaccine Bone densitometry screening Shingrix  Lung Cancer Screening Recommended: no (Low Dose CT Chest recommended if Age 56-80 years, 30 pack-year currently smoking OR have quit w/in past 15 years) Hepatitis C Screening recommended: no HIV Screening recommended: no  Advanced Directives: Written information was not given per the patient's request.  Referrals & Orders No orders of the defined types were placed in this encounter.   Follow-up Plan Follow-up with your new PCP as planned Schedule your shingrix vaccine and tetanus vaccine at your pharmacy. Let us know if you would like for [place] to send the bone density referral. Medicare wellness visit in one year.  AVS printed and given to the patient.   I have personally reviewed and noted the following in the patients chart:   Medical and social history Use of alcohol, tobacco or illicit drugs  Current medications and supplements Functional ability and status Nutritional status Physical activity Advanced directives List of other physicians Hospitalizations, surgeries, and ER visits in previous 12 months Vitals Screenings to include cognitive, depression, and falls Referrals and appointments  In addition, I have reviewed and discussed with patient certain preventive protocols, quality metrics, and best practice recommendations. A written personalized care plan for preventive services as well as general preventive health recommendations were provided to patient.     Tinnie Gens, RN  02/18/2021

## 2021-03-27 ENCOUNTER — Other Ambulatory Visit: Payer: Self-pay

## 2021-03-27 ENCOUNTER — Ambulatory Visit (INDEPENDENT_AMBULATORY_CARE_PROVIDER_SITE_OTHER): Payer: Medicare Other | Admitting: Medical-Surgical

## 2021-03-27 ENCOUNTER — Encounter: Payer: Self-pay | Admitting: Medical-Surgical

## 2021-03-27 VITALS — BP 146/85 | HR 72 | Resp 18 | Ht 63.5 in | Wt 126.0 lb

## 2021-03-27 DIAGNOSIS — Z0000 Encounter for general adult medical examination without abnormal findings: Secondary | ICD-10-CM | POA: Diagnosis not present

## 2021-03-27 DIAGNOSIS — I10 Essential (primary) hypertension: Secondary | ICD-10-CM | POA: Diagnosis not present

## 2021-03-27 DIAGNOSIS — E782 Mixed hyperlipidemia: Secondary | ICD-10-CM | POA: Diagnosis not present

## 2021-03-27 DIAGNOSIS — Z7689 Persons encountering health services in other specified circumstances: Secondary | ICD-10-CM

## 2021-03-27 NOTE — Progress Notes (Signed)
HPI: ?Selena Mccarthy is a 86 y.o. female who  has a past medical history of Colon cancer ([place]), DVT, lower extremity ([place]) (1978), Hyperlipidemia, and Hypertension. ? ?she presents to [facility] today, 03/27/21,  ?for chief complaint of: ?Annual physical exam ?Transfer of care ? ?Dentist: UTD, upcoming appointment ?Eye exam: UTD, wears glasses ?Exercise: Stays very active on a regular basis ?Diet: Eats "half right" ?Pap smear: Aged out ?Mammogram: Aged out ?COVID vaccine: Done, no boosters ? ?Concerns: ?None ? ?Past medical, surgical, social and family history reviewed: ? ?Patient Active Problem List  ? Diagnosis Date Noted  ? History of deep vein thrombosis (DVT) of lower extremity 07/11/2018  ? Microscopic hematuria 07/11/2018  ? Diuretic-induced hypokalemia 08/17/2017  ? Medial epicondylitis of elbow, right 05/04/2017  ? Elevated blood pressure reading in office with diagnosis of hypertension 01/06/2017  ? Uncontrolled stage 2 hypertension 12/22/2016  ? History of colon cancer 12/22/2016  ? Family history of stroke or transient ischemic attack in father 12/22/2016  ? Hyperlipidemia 10/02/2013  ? Essential hypertension 09/14/2013  ? ? ?Past Surgical History:  ?Procedure Laterality Date  ? ABDOMINAL HYSTERECTOMY    ? APPENDECTOMY    ? COLON SURGERY    ? ? ?Social History  ? ?Tobacco Use  ? Smoking status: Never  ? Smokeless tobacco: Never  ?Substance Use Topics  ? Alcohol use: Yes  ?  Alcohol/week: 1.0 standard drink  ?  Types: 1 Shots of liquor per week  ?  Comment: 1 teaspoon of liquor in tea every morning  ? ? ?Family History  ?Problem Relation Age of Onset  ? Uterine cancer Mother   ? Stroke Father   ?  ? ?Current medication list and allergy/intolerance information reviewed:   ? ?Current Outpatient Medications  ?Medication Sig Dispense Refill  ? aspirin 81 MG tablet Take 81 mg by mouth daily.    ? hydrochlorothiazide (HYDRODIURIL) 25 MG tablet Take 1 tablet (25 mg total) by  mouth daily. 90 tablet 3  ? lisinopril (ZESTRIL) 30 MG tablet Take 1 tablet (30 mg total) by mouth daily. 90 tablet 3  ? Multiple Vitamin (MULTIVITAMIN) capsule Take 1 capsule by mouth daily.    ? potassium chloride SA (K-DUR) 20 MEQ tablet Take 1 tablet (20 mEq total) by mouth every other day. 1 tab PO bid x 2 days, then 1 tab PO daily 45 tablet 1  ? ?No current facility-administered medications for this visit.  ? ? ?Allergies  ?Allergen Reactions  ? Influenza Vaccines Anaphylaxis  ? ?  ? ?Review of Systems: ?Constitutional:  No  fever, no chills, No recent illness, No unintentional weight changes. No significant fatigue.  ?HEENT: No  headache, no vision change, no hearing change, No sore throat, No  sinus pressure ?Cardiac: No  chest pain, No  pressure, No palpitations, No  Orthopnea ?Respiratory:  No  shortness of breath. No  Cough ?Gastrointestinal: No  abdominal pain, No  nausea, No  vomiting,  No  blood in stool, No  diarrhea, No  constipation  ?Musculoskeletal: No new myalgia/arthralgia ?Skin: No  Rash, No other wounds/concerning lesions ?Genitourinary: No  incontinence, No  abnormal genital bleeding, No abnormal genital discharge ?Hem/Onc: No  easy bruising/bleeding, No  abnormal lymph node ?Endocrine: No cold intolerance,  No heat intolerance. No polyuria/polydipsia/polyphagia  ?Neurologic: No  weakness, No  dizziness, No  slurred speech/focal weakness/facial droop ?Psychiatric: No  concerns with depression, No  concerns with anxiety, No sleep problems,  No mood problems ? ?Exam:  ?BP (!) 146/85   Pulse 72   Resp 18   Ht 5' 3.5" (1.613 m)   Wt 126 lb (57.2 kg)   SpO2 98%   BMI 21.97 kg/m?  ?Constitutional: VS see above. General Appearance: alert, well-developed, well-nourished, NAD ?Eyes: Normal lids and conjunctive, non-icteric sclera ?Ears, Nose, Mouth, Throat: MMM, Normal external inspection ears/nares/mouth/lips/gums. TM normal bilaterally.  ?Neck: No masses, trachea midline. No thyroid  enlargement. No tenderness/mass appreciated. No lymphadenopathy ?Respiratory: Normal respiratory effort. no wheeze, no rhonchi, no rales ?Cardiovascular: S1/S2 normal, no murmur, no rub/gallop auscultated. RRR. No lower extremity edema. Pedal pulse II/IV bilaterally PT. No carotid bruit or JVD. No abdominal aortic bruit. ?Gastrointestinal: Nontender, no masses. No hepatomegaly, no splenomegaly. No hernia appreciated. Bowel sounds normal. Rectal exam deferred.  ?Musculoskeletal: Gait normal. No clubbing/cyanosis of digits.  ?Neurological: Normal balance/coordination. No tremor. No cranial nerve deficit on limited exam. Motor and sensation intact and symmetric. Cerebellar reflexes intact.  ?Skin: warm, dry, intact. No rash/ulcer. No concerning nevi or subq nodules on limited exam.   ?Psychiatric: Normal judgment/insight. Normal mood and affect. Oriented x3.  ? ? ?ASSESSMENT/PLAN:  ? ?1. Encounter to establish care ?Reviewed available information and discussed care concerns with patient.  ? ?2. Annual physical exam ?Checking labs as below.  Up-to-date on preventative care.  Wellness information provided with AVS. ?- CBC with Differential/Platelet ?- COMPLETE METABOLIC PANEL WITH GFR ?- Lipid Panel w/reflex Direct LDL ? ?3. Essential hypertension ?Checking labs today.  Blood pressure is slightly elevated today but is 111/77 on recheck.  Continue lisinopril 30 mg daily. ? ?4. Mixed hyperlipidemia ?Checking lipid panel today. ? ? ?Orders Placed This Encounter  ?Procedures  ? CBC with Differential/Platelet  ? COMPLETE METABOLIC PANEL WITH GFR  ? Lipid Panel w/reflex Direct LDL  ? ? ?No orders of the defined types were placed in this encounter. ? ? ?Patient Instructions  ?Preventive Care 77 Years and Older, Female ?Preventive care refers to lifestyle choices and visits with your health care provider that can promote health and wellness. Preventive care visits are also called wellness exams. ?What can I expect for my  preventive care visit? ?Counseling ?Your health care provider may ask you questions about your: ?Medical history, including: ?Past medical problems. ?Family medical history. ?Pregnancy and menstrual history. ?History of falls. ?Current health, including: ?Memory and ability to understand (cognition). ?Emotional well-being. ?Home life and relationship well-being. ?Sexual activity and sexual health. ?Lifestyle, including: ?Alcohol, nicotine or tobacco, and drug use. ?Access to firearms. ?Diet, exercise, and sleep habits. ?Work and work Statistician. ?Sunscreen use. ?Safety issues such as seatbelt and bike helmet use. ?Physical exam ?Your health care provider will check your: ?Height and weight. These may be used to calculate your BMI (body mass index). BMI is a measurement that tells if you are at a healthy weight. ?Waist circumference. This measures the distance around your waistline. This measurement also tells if you are at a healthy weight and may help predict your risk of certain diseases, such as type 2 diabetes and high blood pressure. ?Heart rate and blood pressure. ?Body temperature. ?Skin for abnormal spots. ?What immunizations do I need? ?Vaccines are usually given at various ages, according to a schedule. Your health care provider will recommend vaccines for you based on your age, medical history, and lifestyle or other factors, such as travel or where you work. ?What tests do I need? ?Screening ?Your health care provider may recommend screening tests for certain  conditions. This may include: ?Lipid and cholesterol levels. ?Hepatitis C test. ?Hepatitis B test. ?HIV (human immunodeficiency virus) test. ?STI (sexually transmitted infection) testing, if you are at risk. ?Lung cancer screening. ?Colorectal cancer screening. ?Diabetes screening. This is done by checking your blood sugar (glucose) after you have not eaten for a while (fasting). ?Mammogram. Talk with your health care provider about how often you  should have regular mammograms. ?BRCA-related cancer screening. This may be done if you have a family history of breast, ovarian, tubal, or peritoneal cancers. ?Bone density scan. This is done to screen for osteoporosis. ?Talk

## 2021-03-27 NOTE — Patient Instructions (Signed)
Preventive Care 34 Years and Older, Female ?Preventive care refers to lifestyle choices and visits with your health care provider that can promote health and wellness. Preventive care visits are also called wellness exams. ?What can I expect for my preventive care visit? ?Counseling ?Your health care provider may ask you questions about your: ?Medical history, including: ?Past medical problems. ?Family medical history. ?Pregnancy and menstrual history. ?History of falls. ?Current health, including: ?Memory and ability to understand (cognition). ?Emotional well-being. ?Home life and relationship well-being. ?Sexual activity and sexual health. ?Lifestyle, including: ?Alcohol, nicotine or tobacco, and drug use. ?Access to firearms. ?Diet, exercise, and sleep habits. ?Work and work Statistician. ?Sunscreen use. ?Safety issues such as seatbelt and bike helmet use. ?Physical exam ?Your health care provider will check your: ?Height and weight. These may be used to calculate your BMI (body mass index). BMI is a measurement that tells if you are at a healthy weight. ?Waist circumference. This measures the distance around your waistline. This measurement also tells if you are at a healthy weight and may help predict your risk of certain diseases, such as type 2 diabetes and high blood pressure. ?Heart rate and blood pressure. ?Body temperature. ?Skin for abnormal spots. ?What immunizations do I need? ?Vaccines are usually given at various ages, according to a schedule. Your health care provider will recommend vaccines for you based on your age, medical history, and lifestyle or other factors, such as travel or where you work. ?What tests do I need? ?Screening ?Your health care provider may recommend screening tests for certain conditions. This may include: ?Lipid and cholesterol levels. ?Hepatitis C test. ?Hepatitis B test. ?HIV (human immunodeficiency virus) test. ?STI (sexually transmitted infection) testing, if you are at  risk. ?Lung cancer screening. ?Colorectal cancer screening. ?Diabetes screening. This is done by checking your blood sugar (glucose) after you have not eaten for a while (fasting). ?Mammogram. Talk with your health care provider about how often you should have regular mammograms. ?BRCA-related cancer screening. This may be done if you have a family history of breast, ovarian, tubal, or peritoneal cancers. ?Bone density scan. This is done to screen for osteoporosis. ?Talk with your health care provider about your test results, treatment options, and if necessary, the need for more tests. ?Follow these instructions at home: ?Eating and drinking ? ?Eat a diet that includes fresh fruits and vegetables, whole grains, lean protein, and low-fat dairy products. Limit your intake of foods with high amounts of sugar, saturated fats, and salt. ?Take vitamin and mineral supplements as recommended by your health care provider. ?Do not drink alcohol if your health care provider tells you not to drink. ?If you drink alcohol: ?Limit how much you have to 0-1 drink a day. ?Know how much alcohol is in your drink. In the [place] one drink equals one 12 oz bottle of beer (355 mL), one 5 oz glass of wine (148 mL), or one 1? oz glass of hard liquor (44 mL). ?Lifestyle ?Brush your teeth every morning and night with fluoride toothpaste. Floss one time each day. ?Exercise for at least 30 minutes 5 or more days each week. ?Do not use any products that contain nicotine or tobacco. These products include cigarettes, chewing tobacco, and vaping devices, such as e-cigarettes. If you need help quitting, ask your health care provider. ?Do not use drugs. ?If you are sexually active, practice safe sex. Use a condom or other form of protection in order to prevent STIs. ?Take aspirin only as told by your  health care provider. Make sure that you understand how much to take and what form to take. Work with your health care provider to find out whether it  is safe and beneficial for you to take aspirin daily. ?Ask your health care provider if you need to take a cholesterol-lowering medicine (statin). ?Find healthy ways to manage stress, such as: ?Meditation, yoga, or listening to music. ?Journaling. ?Talking to a trusted person. ?Spending time with friends and family. ?Minimize exposure to UV radiation to reduce your risk of skin cancer. ?Safety ?Always wear your seat belt while driving or riding in a vehicle. ?Do not drive: ?If you have been drinking alcohol. Do not ride with someone who has been drinking. ?When you are tired or distracted. ?While texting. ?If you have been using any mind-altering substances or drugs. ?Wear a helmet and other protective equipment during sports activities. ?If you have firearms in your house, make sure you follow all gun safety procedures. ?What's next? ?Visit your health care provider once a year for an annual wellness visit. ?Ask your health care provider how often you should have your eyes and teeth checked. ?Stay up to date on all vaccines. ?This information is not intended to replace advice given to you by your health care provider. Make sure you discuss any questions you have with your health care provider. ?Document Revised: 06/25/2020 Document Reviewed: 06/25/2020 ?Elsevier Patient Education ? [place]. ? ?

## 2021-03-28 LAB — CBC WITH DIFFERENTIAL/PLATELET
Absolute Monocytes: 233 cells/uL (ref 200–950)
Basophils Absolute: 41 cells/uL (ref 0–200)
Basophils Relative: 1.1 %
Eosinophils Absolute: 19 cells/uL (ref 15–500)
Eosinophils Relative: 0.5 %
HCT: 38.6 % (ref 35.0–45.0)
Hemoglobin: 12.7 g/dL (ref 11.7–15.5)
Lymphs Abs: 1376 cells/uL (ref 850–3900)
MCH: 29.8 pg (ref 27.0–33.0)
MCHC: 32.9 g/dL (ref 32.0–36.0)
MCV: 90.6 fL (ref 80.0–100.0)
MPV: 11 fL (ref 7.5–12.5)
Monocytes Relative: 6.3 %
Neutro Abs: 2031 cells/uL (ref 1500–7800)
Neutrophils Relative %: 54.9 %
Platelets: 211 10*3/uL (ref 140–400)
RBC: 4.26 10*6/uL (ref 3.80–5.10)
RDW: 12.3 % (ref 11.0–15.0)
Total Lymphocyte: 37.2 %
WBC: 3.7 10*3/uL — ABNORMAL LOW (ref 3.8–10.8)

## 2021-03-28 LAB — LIPID PANEL W/REFLEX DIRECT LDL
Cholesterol: 231 mg/dL — ABNORMAL HIGH (ref <200)
HDL: 68 mg/dL (ref >=50)
LDL Cholesterol (Calc): 143 mg/dL (calc) — ABNORMAL HIGH
Non-HDL Cholesterol (Calc): 163 mg/dL (calc) — ABNORMAL HIGH (ref <130)
Total CHOL/HDL Ratio: 3.4 (calc) (ref <5.0)
Triglycerides: 92 mg/dL (ref <150)

## 2021-03-28 LAB — COMPLETE METABOLIC PANEL WITH GFR
AG Ratio: 1.5 (calc) (ref 1.0–2.5)
ALT: 10 U/L (ref 6–29)
AST: 20 U/L (ref 10–35)
Albumin: 4.5 g/dL (ref 3.6–5.1)
Alkaline phosphatase (APISO): 56 U/L (ref 37–153)
BUN: 17 mg/dL (ref 7–25)
CO2: 31 mmol/L (ref 20–32)
Calcium: 10.3 mg/dL (ref 8.6–10.4)
Chloride: 101 mmol/L (ref 98–110)
Creat: 0.79 mg/dL (ref 0.60–0.95)
Globulin: 3 g/dL (calc) (ref 1.9–3.7)
Glucose, Bld: 92 mg/dL (ref 65–139)
Potassium: 3.8 mmol/L (ref 3.5–5.3)
Sodium: 143 mmol/L (ref 135–146)
Total Bilirubin: 0.7 mg/dL (ref 0.2–1.2)
Total Protein: 7.5 g/dL (ref 6.1–8.1)
eGFR: 74 mL/min/{1.73_m2} (ref >=60)

## 2021-05-23 ENCOUNTER — Other Ambulatory Visit: Payer: Self-pay | Admitting: Osteopathic Medicine

## 2021-05-23 DIAGNOSIS — I10 Essential (primary) hypertension: Secondary | ICD-10-CM

## 2021-10-02 ENCOUNTER — Ambulatory Visit (INDEPENDENT_AMBULATORY_CARE_PROVIDER_SITE_OTHER): Payer: Medicare Other | Admitting: Medical-Surgical

## 2021-10-02 ENCOUNTER — Encounter: Payer: Self-pay | Admitting: Medical-Surgical

## 2021-10-02 VITALS — BP 144/84 | HR 71 | Resp 20 | Ht 63.5 in | Wt 124.8 lb

## 2021-10-02 DIAGNOSIS — I10 Essential (primary) hypertension: Secondary | ICD-10-CM | POA: Diagnosis not present

## 2021-10-02 NOTE — Progress Notes (Addendum)
   Established Patient Office Visit  Subjective   Patient ID: Selena Mccarthy, female   DOB: 01/28/1935 Age: 86 y.o. MRN: [id number]   Chief Complaint  Patient presents with   Hypertension   HPI Very pleasant 86 year old female presenting today for hypertension follow-up.  She has been taking lisinopril 30 mg daily and hydrochlorothiazide 25 mg daily as prescribed, tolerating well without side effects.  Checks her blood pressure intermittently at home and reports her readings are usually good.  Recently had housecalls nurse out to discuss her care.  Reports that her blood pressure at that visit was quite well.  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  She has to get them up and ready for school in the morning, get them breakfast, and then to school which causes her to be a little more stressed out.  Notes that when she gets back home, her stress level goes down which brings her blood pressure back down.  She stays very active during the day.  Follows a low-sodium diet although she does admit to occasional indulgences (loves crab legs) that have extra seasoning on them. Denies CP, SOB, palpitations, lower extremity edema, dizziness, headaches, or vision changes.   Objective:    Vitals:   10/02/21 1014  BP: (!) 144/84  Pulse: 71  Resp: 20  Height: 5' 3.5" (1.613 m)  Weight: 124 lb 12.8 oz (56.6 kg)  SpO2: 96%  BMI (Calculated): 21.76   Physical Exam Vitals and nursing note reviewed.  Constitutional:      General: She is not in acute distress.    Appearance: Normal appearance. She is not ill-appearing.  HENT:     Head: Normocephalic and atraumatic.  Cardiovascular:     Rate and Rhythm: Normal rate and regular rhythm.     Pulses: Normal pulses.     Heart sounds: Normal heart sounds.  Pulmonary:     Effort: Pulmonary effort is normal. No respiratory distress.     Breath sounds: Normal breath sounds. No wheezing,  rhonchi or rales.  Skin:    General: Skin is warm and dry.  Neurological:     Mental Status: She is alert and oriented to person, place, and time.  Psychiatric:        Mood and Affect: Mood normal.        Behavior: Behavior normal.        Thought Content: Thought content normal.        Judgment: Judgment normal.   No results found for this or any previous visit (from the past 24 hour(s)).   The ASCVD Risk score (Arnett DK, et al., 2019) failed to calculate for the following reasons:   The 2019 ASCVD risk score is only valid for ages 4 to 16   Assessment & Plan:   1. Essential hypertension Blood pressure slightly elevated today however at home her readings have been at goal.  Recent housecalls reports sent over with home reading of 110/80.  Continue lisinopril 30 mg daily and hydrochlorothiazide 25 mg daily as prescribed.  Monitor blood pressure at home with goal of 130/80 or less.  Recommend low-sodium diet and regular intentional activity.  Return in about 6 months (around 04/02/2022) for annual physical exam or sooner if needed.  ___________________________________________ Clearnce Sorrel, DNP, APRN, FNP-BC [facility] [place]

## 2021-10-05 ENCOUNTER — Other Ambulatory Visit: Payer: Self-pay

## 2021-10-05 DIAGNOSIS — I10 Essential (primary) hypertension: Secondary | ICD-10-CM

## 2021-10-05 MED ORDER — HYDROCHLOROTHIAZIDE 25 MG PO TABS: 25.0000 mg | ORAL_TABLET | Freq: Every day | ORAL | 3 refills | Status: DC

## 2022-02-26 ENCOUNTER — Ambulatory Visit (INDEPENDENT_AMBULATORY_CARE_PROVIDER_SITE_OTHER): Payer: Medicare Other | Admitting: Medical-Surgical

## 2022-02-26 VITALS — BP 128/82 | HR 70 | Ht 61.0 in | Wt 123.1 lb

## 2022-02-26 DIAGNOSIS — Z0000 Encounter for general adult medical examination without abnormal findings: Secondary | ICD-10-CM | POA: Diagnosis not present

## 2022-02-26 NOTE — Progress Notes (Signed)
MEDICARE ANNUAL WELLNESS VISIT  02/26/2022  Subjective:  Selena Mccarthy is a 87 y.o. female patient of Samuel Bouche, NP who had a Medicare Annual Wellness Visit today. Selena Mccarthy is Retired and lives with their daughter. she has 4 children. she reports that she is socially active and does interact with friends/family regularly. she is minimally physically active and enjoys going to the casino.  Patient Care Team: Samuel Bouche, NP as PCP - General (Nurse Practitioner)     02/26/2022    9:51 AM 02/18/2021   11:02 AM 11/30/2019   10:38 AM 10/09/2018   11:10 AM  Advanced Directives  Does Patient Have a Medical Advance Directive? No No No No  Would patient like information on creating a medical advance directive? No - Patient declined No - Patient declined Yes (MAU/Ambulatory/Procedural Areas - Information given) No - Patient declined    Hospital Utilization Over the Past 12 Months: # of hospitalizations or ER visits: 0 # of surgeries: 0  Review of Systems    Patient reports that her overall health is unchanged when compared to last year.  Review of Systems: History obtained from chart review and the patient  All other systems negative.  Pain Assessment Pain : No/denies pain     Current Medications & Allergies (verified) Allergies as of 02/26/2022       Reactions   Influenza Vaccines Anaphylaxis        Medication List        Accurate as of February 26, 2022 10:11 AM. If you have any questions, ask your nurse or doctor.          aspirin 81 MG tablet Take 81 mg by mouth daily.   hydrochlorothiazide 25 MG tablet Commonly known as: HYDRODIURIL Take 1 tablet (25 mg total) by mouth daily.   lisinopril 30 MG tablet Commonly known as: ZESTRIL TAKE 1 TABLET BY MOUTH EVERY DAY   multivitamin capsule Take 1 capsule by mouth daily.        History (reviewed): Past Medical History:  Diagnosis Date   Colon cancer ([place])    DVT, lower extremity ([place]) 1978   Left    Hyperlipidemia    Hypertension    Past Surgical History:  Procedure Laterality Date   ABDOMINAL HYSTERECTOMY     APPENDECTOMY     COLON SURGERY     Family History  Problem Relation Age of Onset   Uterine cancer Mother    Stroke Father    Social History   Socioeconomic History   Marital status: Single    Spouse name: Not on file   Number of children: 4   Years of education: 12   Highest education level: 12th grade  Occupational History   Occupation: Scientist, product/process development    Comment: retired  Tobacco Use   Smoking status: Never   Smokeless tobacco: Never  Vaping Use   Vaping Use: Never used  Substance and Sexual Activity   Alcohol use: Yes    Alcohol/week: 1.0 standard drink of alcohol    Types: 1 Shots of liquor per week    Comment: 1 teaspoon of liquor in tea every morning   Drug use: No   Sexual activity: Not Currently    Birth control/protection: Surgical    Comment: Hysterectomy  Other Topics Concern   Not on file  Social History Narrative   Lives with her daughter and granddaughter. She has four children. She enjoys going to the casino.   Social Determinants of Health  Financial Resource Strain: Low Risk  (02/26/2022)   Overall Financial Resource Strain (CARDIA)    Difficulty of Paying Living Expenses: Not hard at all  Food Insecurity: No Food Insecurity (02/26/2022)   Hunger Vital Sign    Worried About Running Out of Food in the Last Year: Never true    Ran Out of Food in the Last Year: Never true  Transportation Needs: No Transportation Needs (02/26/2022)   PRAPARE - Hydrologist (Medical): No    Lack of Transportation (Non-Medical): No  Physical Activity: Inactive (02/26/2022)   Exercise Vital Sign    Days of Exercise per Week: 0 days    Minutes of Exercise per Session: 0 min  Stress: No Stress Concern Present (02/26/2022)   [place]    Feeling of Stress : Not  at all  Social Connections: Moderately Isolated (02/26/2022)   Social Connection and Isolation Panel [NHANES]    Frequency of Communication with Friends and Family: More than three times a week    Frequency of Social Gatherings with Friends and Family: More than three times a week    Attends Religious Services: More than 4 times per year    Active Member of [employer] or Organizations: No    Attends Archivist Meetings: Never    Marital Status: Widowed    Activities of Daily Living    02/26/2022    9:58 AM  In your present state of health, do you have any difficulty performing the following activities:  Hearing? 0  Vision? 0  Difficulty concentrating or making decisions? 0  Walking or climbing stairs? 0  Dressing or bathing? 0  Doing errands, shopping? 0  Preparing Food and eating ? N  Using the Toilet? N  In the past six months, have you accidently leaked urine? Y  Comment some leakage  Do you have problems with loss of bowel control? N  Managing your Medications? N  Managing your Finances? N  Housekeeping or managing your Housekeeping? N    Patient Education/Literacy How often do you need to have someone help you when you read instructions, pamphlets, or other written materials from your doctor or pharmacy?: 1 - Never What is the last grade level you completed in school?: 12th grade  Exercise Current Exercise Habits: The patient does not participate in regular exercise at present, Exercise limited by: None identified  Diet Patient reports consuming 2 meals a day and 1 snack(s) a day Patient reports that her primary diet is: Regular Patient reports that she does have regular access to food.   Depression Screen    02/26/2022    9:52 AM 10/02/2021   10:34 AM 03/27/2021   10:08 AM 02/18/2021   11:03 AM 11/30/2019   10:40 AM 10/09/2018   11:11 AM 07/11/2018    8:55 AM  PHQ 2/9 Scores  PHQ - 2 Score 0 0 0 0 0 0 0     Fall Risk    02/26/2022    9:52 AM 03/27/2021    10:08 AM 02/18/2021   11:03 AM 11/30/2019   10:40 AM 10/09/2018   11:11 AM  Fall Risk   Falls in the past year? 0 0 0 0 0  Number falls in past yr: 0 0 0 0 0  Injury with Fall? 0 0 0 0 0  Risk for fall due to : No Fall Risks No Fall Risks No Fall Risks  Follow up Falls evaluation completed Falls prevention discussed;Falls evaluation completed Falls evaluation completed;Education provided Falls evaluation completed Falls prevention discussed     Objective:   BP 128/82 (BP Location: Left Arm, Patient Position: Sitting, Cuff Size: Normal)   Pulse 70   Ht 5' 1"$  (1.549 m)   Wt 123 lb 1.3 oz (55.8 kg)   BMI 23.26 kg/m   Last Weight  Most recent update: 02/26/2022  9:47 AM    Weight  55.8 kg (123 lb 1.3 oz)             Body mass index is 23.26 kg/m.  Hearing/Vision  Selena Mccarthy did not have difficulty with hearing/understanding during the face-to-face interview Selena Mccarthy did not have difficulty with her vision during the face-to-face interview Reports that she has had a formal eye exam by an eye care professional within the past year Reports that she has not had a formal hearing evaluation within the past year  Cognitive Function:    02/26/2022   10:02 AM 02/18/2021   11:11 AM 11/30/2019   10:41 AM 10/09/2018   11:14 AM 08/03/2017    9:44 AM  6CIT Screen  What Year? 0 points 0 points 0 points 0 points 0 points  What month? 0 points 0 points 0 points 0 points 0 points  What time? 0 points 0 points 0 points 0 points 0 points  Count back from 20 0 points 0 points 0 points 0 points 0 points  Months in reverse 0 points 0 points 0 points 0 points 0 points  Repeat phrase 0 points 0 points 0 points 0 points 6 points  Total Score 0 points 0 points 0 points 0 points 6 points    Normal Cognitive Function Screening: Yes (Normal:0-7, Significant for Dysfunction: >8)  Immunization & Health Maintenance Record Immunization History  Administered Date(s) Administered   PFIZER(Purple  Top)SARS-COV-2 Vaccination 05/30/2019, 06/27/2019   Pneumococcal Conjugate-13 09/14/2013   Pneumococcal Polysaccharide-23 05/04/2017    Health Maintenance  Topic Date Due   DTaP/Tdap/Td (1 - Tdap) Never done   COVID-19 Vaccine (3 - 2023-24 season) 03/14/2022 (Originally 09/11/2021)   Zoster Vaccines- Shingrix (1 of 2) 05/27/2022 (Originally 11/18/1987)   DEXA SCAN  02/27/2023 (Originally 10/17/2020)   Medicare Annual Wellness (AWV)  02/27/2023   Pneumonia Vaccine 39+ Years old  Completed   HPV VACCINES  Aged Out       Assessment  This is a routine wellness examination for Selena Mccarthy.  Health Maintenance: Due or Overdue Health Maintenance Due  Topic Date Due   DTaP/Tdap/Td (1 - Tdap) Never done    Selena Mccarthy does not need a referral for Community Assistance: Care Management:   no Social Work:    no Prescription Assistance:  no Nutrition/Diabetes Education:  no   Plan:  Personalized Goals  Goals Addressed               This Visit's Progress     Patient Stated (pt-stated)        Patient stated that she would like to maintain her healthy lifestyle.       Personalized Health Maintenance & Screening Recommendations  Td vaccine Bone densitometry screening Shingles vaccine  Patient declined bone density and the vaccines at this time.  Lung Cancer Screening Recommended: no (Low Dose CT Chest recommended if Age 12-80 years, 30 pack-year currently smoking OR have quit w/in past 15 years) Hepatitis C Screening recommended: no HIV Screening recommended: no  Advanced Directives: Written information  was not given per the patient's request.  Referrals & Orders No orders of the defined types were placed in this encounter.   Follow-up Plan Follow-up with Samuel Bouche, NP as planned Medicare wellness visit in one year.  AVS printed and given to the patient.   I have personally reviewed and noted the following in the patient's chart:   Medical and social  history Use of alcohol, tobacco or illicit drugs  Current medications and supplements Functional ability and status Nutritional status Physical activity Advanced directives List of other physicians Hospitalizations, surgeries, and ER visits in previous 12 months Vitals Screenings to include cognitive, depression, and falls Referrals and appointments  In addition, I have reviewed and discussed with patient certain preventive protocols, quality metrics, and best practice recommendations. A written personalized care plan for preventive services as well as general preventive health recommendations were provided to patient.     Tinnie Gens, RN BSN  02/26/2022

## 2022-02-26 NOTE — Patient Instructions (Addendum)
[facility] Maintenance Summary and Written Plan of Care  [name] ,  Thank you for allowing me to perform your Medicare Annual Wellness Visit and for your ongoing commitment to your health.   Health Maintenance & Immunization History Health Maintenance  Topic Date Due   DTaP/Tdap/Td (1 - Tdap) Never done   COVID-19 Vaccine (3 - 2023-24 season) 03/14/2022 (Originally 09/11/2021)   Zoster Vaccines- Shingrix (1 of 2) 05/27/2022 (Originally 11/18/1987)   DEXA SCAN  02/27/2023 (Originally 10/17/2020)   Medicare Annual Wellness (AWV)  02/27/2023   Pneumonia Vaccine 75+ Years old  Completed   HPV VACCINES  Aged Out   Immunization History  Administered Date(s) Administered   PFIZER(Purple Top)SARS-COV-2 Vaccination 05/30/2019, 06/27/2019   Pneumococcal Conjugate-13 09/14/2013   Pneumococcal Polysaccharide-23 05/04/2017    These are the patient goals that we discussed:  Goals Addressed               This Visit's Progress     Patient Stated (pt-stated)        Patient stated that she would like to maintain her healthy lifestyle.         This is a list of Health Maintenance Items that are overdue or due now: Health Maintenance Due  Topic Date Due   DTaP/Tdap/Td (1 - Tdap) Never done   Bone densitometry screening Shingles vaccine  Orders/Referrals Placed Today: No orders of the defined types were placed in this encounter.  (Contact our referral department at [phone] if you have not spoken with someone about your referral appointment within the next 5 days)    Follow-up Plan Follow-up with Samuel Bouche, NP as planned Medicare wellness visit in one year.  AVS printed and given to the patient.     Health Maintenance, Female Adopting a healthy lifestyle and getting preventive care are important in promoting health and wellness. Ask your health care provider about: The right schedule for you to have regular tests and exams. Things you can do  on your own to prevent diseases and keep yourself healthy. What should I know about diet, weight, and exercise? Eat a healthy diet  Eat a diet that includes plenty of vegetables, fruits, low-fat dairy products, and lean protein. Do not eat a lot of foods that are high in solid fats, added sugars, or sodium. Maintain a healthy weight Body mass index (BMI) is used to identify weight problems. It estimates body fat based on height and weight. Your health care provider can help determine your BMI and help you achieve or maintain a healthy weight. Get regular exercise Get regular exercise. This is one of the most important things you can do for your health. Most adults should: Exercise for at least 150 minutes each week. The exercise should increase your heart rate and make you sweat (moderate-intensity exercise). Do strengthening exercises at least twice a week. This is in addition to the moderate-intensity exercise. Spend less time sitting. Even light physical activity can be beneficial. Watch cholesterol and blood lipids Have your blood tested for lipids and cholesterol at 85 years of age, then have this test every 5 years. Have your cholesterol levels checked more often if: Your lipid or cholesterol levels are high. You are older than 85 years of age. You are at high risk for heart disease. What should I know about cancer screening? Depending on your health history and family history, you may need to have cancer screening at various ages. This may include screening for: Breast  cancer. Cervical cancer. Colorectal cancer. Skin cancer. Lung cancer. What should I know about heart disease, diabetes, and high blood pressure? Blood pressure and heart disease High blood pressure causes heart disease and increases the risk of stroke. This is more likely to develop in people who have high blood pressure readings or are overweight. Have your blood pressure checked: Every 3-5 years if you are 50-5  years of age. Every year if you are 79 years old or older. Diabetes Have regular diabetes screenings. This checks your fasting blood sugar level. Have the screening done: Once every three years after age 62 if you are at a normal weight and have a low risk for diabetes. More often and at a younger age if you are overweight or have a high risk for diabetes. What should I know about preventing infection? Hepatitis B If you have a higher risk for hepatitis B, you should be screened for this virus. Talk with your health care provider to find out if you are at risk for hepatitis B infection. Hepatitis C Testing is recommended for: Everyone born from [birth year] through 1963. Anyone with known risk factors for hepatitis C. Sexually transmitted infections (STIs) Get screened for STIs, including gonorrhea and chlamydia, if: You are sexually active and are younger than 85 years of age. You are older than 85 years of age and your health care provider tells you that you are at risk for this type of infection. Your sexual activity has changed since you were last screened, and you are at increased risk for chlamydia or gonorrhea. Ask your health care provider if you are at risk. Ask your health care provider about whether you are at high risk for HIV. Your health care provider may recommend a prescription medicine to help prevent HIV infection. If you choose to take medicine to prevent HIV, you should first get tested for HIV. You should then be tested every 3 months for as long as you are taking the medicine. Pregnancy If you are about to stop having your period (premenopausal) and you may become pregnant, seek counseling before you get pregnant. Take 400 to 800 micrograms (mcg) of folic acid every day if you become pregnant. Ask for birth control (contraception) if you want to prevent pregnancy. Osteoporosis and menopause Osteoporosis is a disease in which the bones lose minerals and strength with aging. This  can result in bone fractures. If you are 22 years old or older, or if you are at risk for osteoporosis and fractures, ask your health care provider if you should: Be screened for bone loss. Take a calcium or vitamin D supplement to lower your risk of fractures. Be given hormone replacement therapy (HRT) to treat symptoms of menopause. Follow these instructions at home: Alcohol use Do not drink alcohol if: Your health care provider tells you not to drink. You are pregnant, may be pregnant, or are planning to become pregnant. If you drink alcohol: Limit how much you have to: 0-1 drink a day. Know how much alcohol is in your drink. In the [place] one drink equals one 12 oz bottle of beer (355 mL), one 5 oz glass of wine (148 mL), or one 1 oz glass of hard liquor (44 mL). Lifestyle Do not use any products that contain nicotine or tobacco. These products include cigarettes, chewing tobacco, and vaping devices, such as e-cigarettes. If you need help quitting, ask your health care provider. Do not use street drugs. Do not share needles. Ask your health care  provider for help if you need support or information about quitting drugs. General instructions Schedule regular health, dental, and eye exams. Stay current with your vaccines. Tell your health care provider if: You often feel depressed. You have ever been abused or do not feel safe at home. Summary Adopting a healthy lifestyle and getting preventive care are important in promoting health and wellness. Follow your health care provider's instructions about healthy diet, exercising, and getting tested or screened for diseases. Follow your health care provider's instructions on monitoring your cholesterol and blood pressure. This information is not intended to replace advice given to you by your health care provider. Make sure you discuss any questions you have with your health care provider. Document Revised: 05/19/2020 Document Reviewed:  05/19/2020 Elsevier Patient Education  [employer] [place].

## 2022-03-02 ENCOUNTER — Encounter: Payer: Self-pay | Admitting: Emergency Medicine

## 2022-03-02 ENCOUNTER — Ambulatory Visit
Admission: EM | Admit: 2022-03-02 | Discharge: 2022-03-02 | Disposition: A | Discharge status: Home / Self Care | Payer: Medicare Other | Attending: Urgent Care | Admitting: Urgent Care

## 2022-03-02 DIAGNOSIS — Z87440 Personal history of urinary (tract) infections: Secondary | ICD-10-CM | POA: Diagnosis not present

## 2022-03-02 DIAGNOSIS — I10 Essential (primary) hypertension: Secondary | ICD-10-CM | POA: Insufficient documentation

## 2022-03-02 DIAGNOSIS — Z85038 Personal history of other malignant neoplasm of large intestine: Secondary | ICD-10-CM | POA: Diagnosis not present

## 2022-03-02 DIAGNOSIS — Z86718 Personal history of other venous thrombosis and embolism: Secondary | ICD-10-CM | POA: Insufficient documentation

## 2022-03-02 DIAGNOSIS — R202 Paresthesia of skin: Secondary | ICD-10-CM | POA: Insufficient documentation

## 2022-03-02 DIAGNOSIS — R300 Dysuria: Secondary | ICD-10-CM | POA: Insufficient documentation

## 2022-03-02 DIAGNOSIS — Z9049 Acquired absence of other specified parts of digestive tract: Secondary | ICD-10-CM | POA: Diagnosis not present

## 2022-03-02 DIAGNOSIS — R000 Tachycardia, unspecified: Secondary | ICD-10-CM | POA: Insufficient documentation

## 2022-03-02 DIAGNOSIS — R350 Frequency of micturition: Secondary | ICD-10-CM | POA: Diagnosis not present

## 2022-03-02 DIAGNOSIS — Z90710 Acquired absence of both cervix and uterus: Secondary | ICD-10-CM | POA: Diagnosis not present

## 2022-03-02 DIAGNOSIS — E785 Hyperlipidemia, unspecified: Secondary | ICD-10-CM | POA: Diagnosis not present

## 2022-03-02 DIAGNOSIS — M545 Low back pain, unspecified: Secondary | ICD-10-CM | POA: Insufficient documentation

## 2022-03-02 DIAGNOSIS — R3129 Other microscopic hematuria: Secondary | ICD-10-CM | POA: Diagnosis not present

## 2022-03-02 LAB — POCT URINALYSIS DIP (MANUAL ENTRY)
Bilirubin, UA: NEGATIVE
Glucose, UA: NEGATIVE mg/dL
Ketones, POC UA: NEGATIVE mg/dL
Leukocytes, UA: NEGATIVE
Nitrite, UA: NEGATIVE
Protein Ur, POC: >=300 mg/dL — AB
Spec Grav, UA: 1.025 (ref 1.010–1.025)
Urobilinogen, UA: 0.2 E.U./dL
pH, UA: 7 (ref 5.0–8.0)

## 2022-03-02 MED ORDER — CEPHALEXIN 500 MG PO CAPS: 500.0000 mg | ORAL_CAPSULE | Freq: Two times a day (BID) | ORAL | 0 refills | Status: AC

## 2022-03-02 NOTE — ED Provider Notes (Signed)
[name]    CSN: [id number] Arrival date & time: 03/02/22  1619      History   Chief Complaint Chief Complaint  Patient presents with   Hypertension   Dysuria    HPI Selena Mccarthy is a 87 y.o. female.   Pleasant 87 year old female with a known history of hypertension presents today due to concern of elevated blood pressure.  She states that in the past, her symptoms that will warn her that her blood pressure is elevated is tingling in the head and tingling in the legs.  She states she was outside working in the yard today and started noticing the symptoms. BP prior to presentation was 184/101.  She believes that this may be secondary to her diet.  She states she ate half a bagel with butter and cream cheese, a grapefruit, and a bunch of pork skins.  Patient is denying any shortness of breath or chest pain, but she states she feels like her heart is beating forcefully and quickly.  She denies any headache, blurred vision, dizziness.  She denies any gait disturbance, but she does states she feels weak.  Additionally, she states that last evening she started having urinary frequency.  She is concerned because she now has lower back pain and dysuria.  Admits to UTIs in the past.  Has not seen any visible blood. No fevers.    Hypertension  Dysuria   Past Medical History:  Diagnosis Date   Colon cancer ([place])    DVT, lower extremity ([place]) 1978   Left   Hyperlipidemia    Hypertension     Patient Active Problem List   Diagnosis Date Noted   History of deep vein thrombosis (DVT) of lower extremity 07/11/2018   Microscopic hematuria 07/11/2018   Diuretic-induced hypokalemia 08/17/2017   Medial epicondylitis of elbow, right 05/04/2017   Uncontrolled stage 2 hypertension 12/22/2016   History of colon cancer 12/22/2016   Family history of stroke or transient ischemic attack in father 12/22/2016   Hyperlipidemia 10/02/2013   Essential hypertension 09/14/2013    Past  Surgical History:  Procedure Laterality Date   ABDOMINAL HYSTERECTOMY     APPENDECTOMY     COLON SURGERY      OB History   No obstetric history on file.      Home Medications    Prior to Admission medications   Medication Sig Start Date End Date Taking? Authorizing Provider  cephALEXin (KEFLEX) 500 MG capsule Take 1 capsule (500 mg total) by mouth 2 (two) times daily for 7 days. 03/02/22 03/09/22 Yes Keionna Kinnaird L, PA  aspirin 81 MG tablet Take 81 mg by mouth daily.    [provider]  hydrochlorothiazide (HYDRODIURIL) 25 MG tablet Take 1 tablet (25 mg total) by mouth daily. 10/05/21   Samuel Bouche, NP  lisinopril (ZESTRIL) 30 MG tablet TAKE 1 TABLET BY MOUTH EVERY DAY 05/25/21   Samuel Bouche, NP  Multiple Vitamin (MULTIVITAMIN) capsule Take 1 capsule by mouth daily.    [provider]    Family History Family History  Problem Relation Age of Onset   Uterine cancer Mother    Stroke Father     Social History Social History   Tobacco Use   Smoking status: Never   Smokeless tobacco: Never  Vaping Use   Vaping Use: Never used  Substance Use Topics   Alcohol use: Yes    Alcohol/week: 1.0 standard drink of alcohol    Types: 1 Shots of  liquor per week    Comment: 1 teaspoon of liquor in tea every morning   Drug use: No     Allergies   Influenza vaccines   Review of Systems Review of Systems  Genitourinary:  Positive for dysuria.  As per HPI   Physical Exam Triage Vital Signs ED Triage Vitals [03/02/22 1636]  Enc Vitals Group     BP (!) 174/99     Pulse Rate (!) 108     Resp 18     Temp 97.9 F (36.6 C)     Temp Source Oral     SpO2 99 %     Weight      Height      Head Circumference      Peak Flow      Pain Score 0     Pain Loc      Pain Edu?      Excl. in [place]?    No data found.  Updated Vital Signs BP (!) 183/70 (BP Location: Left Arm)   Pulse (!) 108   Temp 97.9 F (36.6 C) (Oral)   Resp 18   SpO2 99%   Visual  Acuity Right Eye Distance:   Left Eye Distance:   Bilateral Distance:    Right Eye Near:   Left Eye Near:    Bilateral Near:     Physical Exam Vitals and nursing note reviewed.  Constitutional:      General: She is not in acute distress.    Appearance: Normal appearance. She is well-developed. She is not ill-appearing, toxic-appearing or diaphoretic.  HENT:     Head: Normocephalic and atraumatic.     Right Ear: External ear normal.     Left Ear: External ear normal.  Eyes:     Conjunctiva/sclera: Conjunctivae normal.  Cardiovascular:     Rate and Rhythm: Regular rhythm. Tachycardia present.     Heart sounds: No murmur heard.    Comments: With intermittent skipped beats Pulmonary:     Effort: Pulmonary effort is normal. No respiratory distress.     Breath sounds: Normal breath sounds. No stridor. No wheezing, rhonchi or rales.  Chest:     Chest wall: No tenderness.  Abdominal:     General: Abdomen is flat. Bowel sounds are normal. There is no distension.     Palpations: Abdomen is soft.     Tenderness: There is no abdominal tenderness. There is no right CVA tenderness, left CVA tenderness, guarding or rebound.  Musculoskeletal:        General: No swelling.     Cervical back: Neck supple.     Right lower leg: No edema.     Left lower leg: No edema.  Skin:    General: Skin is warm and dry.     Capillary Refill: Capillary refill takes less than 2 seconds.     Findings: No bruising, erythema or rash.  Neurological:     General: No focal deficit present.     Mental Status: She is alert and oriented to person, place, and time.     Cranial Nerves: No cranial nerve deficit.     Sensory: No sensory deficit.     Motor: No weakness.     Gait: Gait normal.  Psychiatric:        Mood and Affect: Mood normal.      UC Treatments / Results  Labs (all labs ordered are listed, but only abnormal results are displayed) Labs Reviewed  POCT URINALYSIS DIP (  MANUAL ENTRY) - Abnormal;  Notable for the following components:      Result Value   Clarity, UA cloudy (*)    Blood, UA large (*)    Protein Ur, POC >=300 (*)    All other components within normal limits  URINE CULTURE  BASIC METABOLIC PANEL  CBC WITH DIFFERENTIAL/PLATELET    EKG   Radiology No results found.  Procedures ED EKG  Date/Time: 03/02/2022 6:01 PM  Performed by: Chaney Malling, PA Authorized by: Chaney Malling, PA   Previous ECG:    Previous ECG:  Compared to current   Similarity:  No change   Comparison ECG info:  From [facility] in 2021 Rhythm:    Rhythm: sinus rhythm   Ectopy:    Ectopy: PAC   ST segments:    ST segments:  Normal Q waves:    Abnormal Q-waves: not present   Other findings:    Other findings: LVH    (including critical care time)  Medications Ordered in UC Medications - No data to display  Initial Impression / Assessment and Plan / UC Course  I have reviewed the triage vital signs and the nursing notes.  Pertinent labs & imaging results that were available during my care of the patient were reviewed by me and considered in my medical decision making (see chart for details).  Clinical Course as of 03/02/22 2149  Tue Mar 02, 2022  1844 Recheck pulse rate 90 [WC]    Clinical Course User Index [WC] Geryl Councilman L, [place]    Tachycardia -patient was in office for over 2 hours being monitored.  EKG was completed which showed sinus rhythm with PACs.  Prior to discharge, patient's pulse rate was 90.  Suspect the tachycardia to be multifactorial, possibly infectious in nature, possibly related to excessive salt intake earlier today.  Patient admitted that the sensation of "pounding" was resolved prior to discharge. Hypertension -patient's normal blood pressure readings at home are around [id number] systolic.  This is an isolated [place] per patient.  She is denying any red flag signs or symptoms that would warrant additional imaging.  Patient was gently rehydrated in  the office with numerous cups of water.  Blood pressure did come down prior to discharge.  I did strongly encourage patient to avoid foods high in sodium.  She has a blood pressure monitor at home and understands how to check this.  Will recommend that patient had to the emergency room should her blood pressure remain elevated in the next 12-24 hours.  Dysuria -no evidence of leukocytes or nitrates on exam.  I did call and discussed this case with my supervising physician who recommended given the timeframe of symptom onset, patient's age, and presentation, to start treatment with antibiotics and obtain a urine culture.  We will also obtain a BMP and CBC to further assess the above symptoms.  Will start cephalexin twice daily x 7 days. Must schedule follow up with PCP in 1-2 weeks.   Final Clinical Impressions(s) / UC Diagnoses   Final diagnoses:  Tachycardia  Primary hypertension  Dysuria     Discharge Instructions      Your urinalysis does show rbc concerning for possible developing infection. I would like to start you on an antibiotic for a possible UTI.  We will send out your urine culture for confirmation. Your EKG appears consistent with an EKG noted from 2021. Your blood pressure elevation needs to be monitored closely. Please monitor your blood  pressure 2-3 times daily to ensure it resolves back to your normal [id number] systolic.  Please call your PCP to schedule a follow-up in 1 to 2 weeks to also recheck your urinalysis. If you continue to have elevated blood pressure or pulse rate over the next 24 hours, I would recommend you be seen in the emergency room.     ED Prescriptions     Medication Sig Dispense Auth. Provider   cephALEXin (KEFLEX) 500 MG capsule Take 1 capsule (500 mg total) by mouth 2 (two) times daily for 7 days. 14 capsule Dioselina Brumbaugh L, PA      PDMP not reviewed this encounter.   Chaney Malling, [place] 03/02/22 2153

## 2022-03-02 NOTE — ED Triage Notes (Signed)
Pt presents with c/o HTN and pain with urination. States she starting "feeling funny" in the back of her head and both legs started tingling. States when this happens she knows her blood pressure is elevated. States BP was 184/101 prior to arrival.   Also adds having a bladder pressure and pain at the end of urination. Symptoms began around 2:30 this afternoon.

## 2022-03-02 NOTE — Discharge Instructions (Signed)
Your urinalysis does show rbc concerning for possible developing infection. I would like to start you on an antibiotic for a possible UTI.  We will send out your urine culture for confirmation. Your EKG appears consistent with an EKG noted from 2021. Your blood pressure elevation needs to be monitored closely. Please monitor your blood pressure 2-3 times daily to ensure it resolves back to your normal [id number] systolic.  Please call your PCP to schedule a follow-up in 1 to 2 weeks to also recheck your urinalysis. If you continue to have elevated blood pressure or pulse rate over the next 24 hours, I would recommend you be seen in the emergency room.

## 2022-03-03 ENCOUNTER — Other Ambulatory Visit: Payer: Self-pay | Admitting: Pharmacist

## 2022-03-03 LAB — BASIC METABOLIC PANEL
BUN/Creatinine Ratio: 25 (ref 12–28)
BUN: 21 mg/dL (ref 8–27)
CO2: 25 mmol/L (ref 20–29)
Calcium: 10.8 mg/dL — ABNORMAL HIGH (ref 8.7–10.3)
Chloride: 97 mmol/L (ref 96–106)
Creatinine, Ser: 0.85 mg/dL (ref 0.57–1.00)
Glucose: 87 mg/dL (ref 70–99)
Potassium: 3.4 mmol/L — ABNORMAL LOW (ref 3.5–5.2)
Sodium: 141 mmol/L (ref 134–144)
eGFR: 68 mL/min/{1.73_m2} (ref >59)

## 2022-03-03 LAB — CBC WITH DIFFERENTIAL/PLATELET
Basophils Absolute: 0 10*3/uL (ref 0.0–0.2)
Basos: 1 %
EOS (ABSOLUTE): 0.1 10*3/uL (ref 0.0–0.4)
Eos: 1 %
Hematocrit: 40.3 % (ref 34.0–46.6)
Hemoglobin: 13.7 g/dL (ref 11.1–15.9)
Immature Grans (Abs): 0 10*3/uL (ref 0.0–0.1)
Immature Granulocytes: 0 %
Lymphocytes Absolute: 1.9 10*3/uL (ref 0.7–3.1)
Lymphs: 35 %
MCH: 29.7 pg (ref 26.6–33.0)
MCHC: 34 g/dL (ref 31.5–35.7)
MCV: 87 fL (ref 79–97)
Monocytes Absolute: 0.3 10*3/uL (ref 0.1–0.9)
Monocytes: 6 %
Neutrophils Absolute: 3.1 10*3/uL (ref 1.4–7.0)
Neutrophils: 57 %
Platelets: 269 10*3/uL (ref 150–450)
RBC: 4.62 x10E6/uL (ref 3.77–5.28)
RDW: 12.2 % (ref 11.7–15.4)
WBC: 5.4 10*3/uL (ref 3.4–10.8)

## 2022-03-03 NOTE — Patient Instructions (Signed)

## 2022-03-03 NOTE — Progress Notes (Signed)
Patient appearing on report for [facility] - Hypertension Control report due to last documented ambulatory blood pressure of 183/70 on 03/02/22. Next appointment with PCP is 04/02/22   Outreached patient to discuss hypertension control and medication management.   Current antihypertensives: lisinopril 75m daily, hydrochlorothiazide 267mdaily   Patient has an automated upper arm home BP machine.  Current blood pressure readings: 112/70 this morning, after yesterday's pressures, this is much improved  Current meal patterns: pork skins were what she thinks sent her salt intake too high, leading to ED visit.  Current physical activity: active yardwork  Patient denies hypotensive signs and symptoms including dizziness, lightheadedness.  Patient reports hypertensive symptoms including pressure on back of her head, tingling in legs.   Assessment/Plan: - Currently controlled, elevated pressures in ED were attributed to high salt intake and a suspected UTI, pt is now on antibiotics and feeling much better this AM - - Reviewed appropriate administration of medication regimen - Reviewed appropriate home BP monitoring technique (avoid caffeine, smoking, and exercise for 30 minutes before checking, rest for at least 5 minutes before taking BP, sit with feet flat on the floor and back against a hard surface, uncross legs, and rest arm on flat surface) - Reviewed to check blood pressure daily, document, and provide at next provider visit - Discussed dietary modifications, such as reduced salt intake, focus on whole grains, vegetables, lean proteins - Discussed goal of 150 minutes of moderate intensity physical activity weekly - Recommend continue current regimen  KeLarinda ButteryPharmD Clinical Pharmacist [phone]

## 2022-03-04 LAB — URINE CULTURE: Culture: NO GROWTH

## 2022-03-05 ENCOUNTER — Ambulatory Visit
Admission: EM | Admit: 2022-03-05 | Discharge: 2022-03-05 | Disposition: A | Discharge status: Home / Self Care | Payer: Medicare Other | Attending: Urgent Care | Admitting: Urgent Care

## 2022-03-05 ENCOUNTER — Encounter: Payer: Self-pay | Admitting: Emergency Medicine

## 2022-03-05 DIAGNOSIS — R300 Dysuria: Secondary | ICD-10-CM | POA: Diagnosis not present

## 2022-03-05 DIAGNOSIS — M545 Low back pain, unspecified: Secondary | ICD-10-CM | POA: Diagnosis not present

## 2022-03-05 DIAGNOSIS — N3001 Acute cystitis with hematuria: Secondary | ICD-10-CM | POA: Diagnosis present

## 2022-03-05 DIAGNOSIS — R310 Gross hematuria: Secondary | ICD-10-CM

## 2022-03-05 LAB — POCT URINALYSIS DIP (MANUAL ENTRY)
Glucose, UA: NEGATIVE mg/dL
Nitrite, UA: NEGATIVE
Protein Ur, POC: >=300 mg/dL — AB
Spec Grav, UA: 1.02 (ref 1.010–1.025)
Urobilinogen, UA: 0.2 E.U./dL
pH, UA: 6 (ref 5.0–8.0)

## 2022-03-05 MED ORDER — CIPROFLOXACIN HCL 500 MG PO TABS: 500.0000 mg | ORAL_TABLET | Freq: Two times a day (BID) | ORAL | 0 refills | Status: DC

## 2022-03-05 NOTE — ED Provider Notes (Signed)
[facility]  Note:  This document was prepared using Systems analyst and may include unintentional dictation errors.  MRN: [place]:[id number] DOB: [birth year]  Subjective:   Selena Mccarthy is a 87 y.o. female presenting for recheck on persistent and worsening dysuria, urinary frequency and urinary urgency, low back pains.  Patient has started to notice that she has a lot more blood in her urine now.  Has been using cephalexin as prescribed.  No fever, nausea, vomiting, vaginal discharge, genital rash.  No chest pain, shortness of breath.  Patient is on a baby aspirin.  Does not take any other blood thinners.  No history of kidney stones.  However, she does have a history of microscopic hematuria.  Patient has been hydrating well.  No current facility-administered medications for this encounter.  Current Outpatient Medications:    aspirin 81 MG tablet, Take 81 mg by mouth daily., Disp: , Rfl:    cephALEXin (KEFLEX) 500 MG capsule, Take 1 capsule (500 mg total) by mouth 2 (two) times daily for 7 days., Disp: 14 capsule, Rfl: 0   hydrochlorothiazide (HYDRODIURIL) 25 MG tablet, Take 1 tablet (25 mg total) by mouth daily., Disp: 90 tablet, Rfl: 3   lisinopril (ZESTRIL) 30 MG tablet, TAKE 1 TABLET BY MOUTH EVERY DAY, Disp: 90 tablet, Rfl: 3   Multiple Vitamin (MULTIVITAMIN) capsule, Take 1 capsule by mouth daily., Disp: , Rfl:    Allergies  Allergen Reactions   Influenza Vaccines Anaphylaxis    Past Medical History:  Diagnosis Date   Colon cancer ([place])    DVT, lower extremity ([place]) 1978   Left   Hyperlipidemia    Hypertension      Past Surgical History:  Procedure Laterality Date   ABDOMINAL HYSTERECTOMY     APPENDECTOMY     COLON SURGERY      Family History  Problem Relation Age of Onset   Uterine cancer Mother    Stroke Father     Social History   Tobacco Use   Smoking status: Never   Smokeless tobacco: Never  Vaping Use   Vaping Use:  Never used  Substance Use Topics   Alcohol use: Yes    Alcohol/week: 1.0 standard drink of alcohol    Types: 1 Shots of liquor per week    Comment: 1 teaspoon of liquor in tea every morning   Drug use: No    ROS   Objective:   Vitals: Pulse 82   Temp 98.6 F (37 C) (Oral)   Resp 18   Wt 123 lb 1.3 oz (55.8 kg)   SpO2 97%   BMI 23.26 kg/m   Physical Exam Constitutional:      General: She is not in acute distress.    Appearance: Normal appearance. She is well-developed. She is not ill-appearing, toxic-appearing or diaphoretic.  HENT:     Head: Normocephalic and atraumatic.     Nose: Nose normal.     Mouth/Throat:     Mouth: Mucous membranes are moist.  Eyes:     General: No scleral icterus.       Right eye: No discharge.        Left eye: No discharge.     Extraocular Movements: Extraocular movements intact.     Conjunctiva/sclera: Conjunctivae normal.  Cardiovascular:     Rate and Rhythm: Normal rate.  Pulmonary:     Effort: Pulmonary effort is normal.  Abdominal:     General: Bowel sounds are normal.  There is no distension.     Palpations: Abdomen is soft. There is no mass.     Tenderness: There is no abdominal tenderness. There is no right CVA tenderness, left CVA tenderness, guarding or rebound.  Musculoskeletal:     Lumbar back: No swelling, edema, deformity, signs of trauma, lacerations, spasms, tenderness or bony tenderness. Normal range of motion. Negative right straight leg raise test and negative left straight leg raise test. No scoliosis.  Skin:    General: Skin is warm and dry.  Neurological:     General: No focal deficit present.     Mental Status: She is alert and oriented to person, place, and time.  Psychiatric:        Mood and Affect: Mood normal.        Behavior: Behavior normal.        Thought Content: Thought content normal.        Judgment: Judgment normal.     Results for orders placed or performed during the hospital encounter of  03/05/22 (from the past 24 hour(s))  POCT urinalysis dipstick     Status: Abnormal   Collection Time: 03/05/22  5:25 PM  Result Value Ref Range   Color, UA red (A) yellow   Clarity, UA cloudy (A) clear   Glucose, UA negative negative mg/dL   Bilirubin, UA large (A) negative   Ketones, POC UA trace (5) (A) negative mg/dL   Spec Grav, UA 1.020 1.010 - 1.025   Blood, UA large (A) negative   pH, UA 6.0 5.0 - 8.0   Protein Ur, POC >=300 (A) negative mg/dL   Urobilinogen, UA 0.2 0.2 or 1.0 E.U./dL   Nitrite, UA Negative Negative   Leukocytes, UA Small (1+) (A) Negative   Recent Results (from the past 2160 hour(s))  POCT urinalysis dipstick     Status: Abnormal   Collection Time: 03/02/22  4:56 PM  Result Value Ref Range   Color, UA yellow yellow   Clarity, UA cloudy (A) clear   Glucose, UA negative negative mg/dL   Bilirubin, UA negative negative   Ketones, POC UA negative negative mg/dL   Spec Grav, UA 1.025 1.010 - 1.025   Blood, UA large (A) negative   pH, UA 7.0 5.0 - 8.0   Protein Ur, POC >=300 (A) negative mg/dL   Urobilinogen, UA 0.2 0.2 or 1.0 E.U./dL   Nitrite, UA Negative Negative   Leukocytes, UA Negative Negative  Urine Culture     Status: None   Collection Time: 03/02/22  6:09 PM   Specimen: Urine, Clean Catch  Result Value Ref Range   Specimen Description URINE, CLEAN CATCH    Special Requests NONE    Culture      NO GROWTH Performed at [facility], [address] [place], [place] [address]    Report Status 03/04/2022 FINAL   Basic metabolic panel     Status: Abnormal   Collection Time: 03/02/22  6:34 PM  Result Value Ref Range   Glucose 87 70 - 99 mg/dL   BUN 21 8 - 27 mg/dL   Creatinine, Ser 0.85 0.57 - 1.00 mg/dL   eGFR 68 >59 mL/min/1.73   BUN/Creatinine Ratio 25 12 - 28   Sodium 141 134 - 144 mmol/L   Potassium 3.4 (L) 3.5 - 5.2 mmol/L   Chloride 97 96 - 106 mmol/L   CO2 25 20 - 29 mmol/L   Calcium 10.8 (H) 8.7 - 10.3 mg/dL  CBC  with  Differential/Platelet     Status: None   Collection Time: 03/02/22  6:34 PM  Result Value Ref Range   WBC 5.4 3.4 - 10.8 x10E3/uL   RBC 4.62 3.77 - 5.28 x10E6/uL   Hemoglobin 13.7 11.1 - 15.9 g/dL   Hematocrit 40.3 34.0 - 46.6 %   MCV 87 79 - 97 fL   MCH 29.7 26.6 - 33.0 pg   MCHC 34.0 31.5 - 35.7 g/dL   RDW 12.2 11.7 - 15.4 %   Platelets 269 150 - 450 x10E3/uL   Neutrophils 57 Not Estab. %   Lymphs 35 Not Estab. %   Monocytes 6 Not Estab. %   Eos 1 Not Estab. %   Basos 1 Not Estab. %   Neutrophils Absolute 3.1 1.4 - 7.0 x10E3/uL   Lymphocytes Absolute 1.9 0.7 - 3.1 x10E3/uL   Monocytes Absolute 0.3 0.1 - 0.9 x10E3/uL   EOS (ABSOLUTE) 0.1 0.0 - 0.4 x10E3/uL   Basophils Absolute 0.0 0.0 - 0.2 x10E3/uL   Immature Granulocytes 0 Not Estab. %   Immature Grans (Abs) 0.0 0.0 - 0.1 x10E3/uL  POCT urinalysis dipstick     Status: Abnormal   Collection Time: 03/05/22  5:25 PM  Result Value Ref Range   Color, UA red (A) yellow    Comment: manual read - 2 attempts on clinetek   Clarity, UA cloudy (A) clear   Glucose, UA negative negative mg/dL   Bilirubin, UA large (A) negative   Ketones, POC UA trace (5) (A) negative mg/dL   Spec Grav, UA 1.020 1.010 - 1.025   Blood, UA large (A) negative   pH, UA 6.0 5.0 - 8.0   Protein Ur, POC >=300 (A) negative mg/dL   Urobilinogen, UA 0.2 0.2 or 1.0 E.U./dL   Nitrite, UA Negative Negative   Leukocytes, UA Small (1+) (A) Negative    Assessment and Plan :   PDMP not reviewed this encounter.  1. Acute cystitis with hematuria   2. Dysuria   3. Gross hematuria     Last urine culture was negative. Will repeat labs. Creatinine clearance calculated at 43 mL/min. Start ciprofloxacin to cover for acute cystitis, urine culture pending.  Symptoms are consistent with hemorrhagic acute cystitis.  Will also have patient continue hydrating very well.  Avoid urinary irritants.  Counseled patient on potential for adverse effects with medications  prescribed/recommended today, ER and return-to-clinic precautions discussed, patient verbalized understanding.    Jaynee Eagles, [place] 03/05/22 [id number]

## 2022-03-05 NOTE — ED Notes (Signed)
Pt given water.

## 2022-03-05 NOTE — ED Triage Notes (Addendum)
Dysuria & burning continues  Hematuria since yesterday Currently taking antibiotic from 03/02/22 Lower back pain since yesterday  BP has been better per pt  Advil  400 mg at 1500

## 2022-03-05 NOTE — Discharge Instructions (Signed)
Please start ciprofloxacin to address an urinary tract infection. Make sure you hydrate very well with plain water and a quantity of 64 ounces of water a day.  Please limit drinks that are considered urinary irritants such as soda, sweet tea, coffee, energy drinks, alcohol.  These can worsen your urinary and genital symptoms but also be the source of them.  I will let you know about your urine culture results through MyChart to see if we need to prescribe or change your antibiotics based off of those results.

## 2022-03-06 LAB — BASIC METABOLIC PANEL
BUN/Creatinine Ratio: 16 (ref 12–28)
BUN: 14 mg/dL (ref 8–27)
CO2: 29 mmol/L (ref 20–29)
Calcium: 10.7 mg/dL — ABNORMAL HIGH (ref 8.7–10.3)
Chloride: 99 mmol/L (ref 96–106)
Creatinine, Ser: 0.85 mg/dL (ref 0.57–1.00)
Glucose: 120 mg/dL — ABNORMAL HIGH (ref 70–99)
Potassium: 3.7 mmol/L (ref 3.5–5.2)
Sodium: 142 mmol/L (ref 134–144)
eGFR: 68 mL/min/{1.73_m2} (ref >59)

## 2022-03-06 LAB — CBC WITH DIFFERENTIAL/PLATELET
Basophils Absolute: 0.1 10*3/uL (ref 0.0–0.2)
Basos: 1 %
EOS (ABSOLUTE): 0.1 10*3/uL (ref 0.0–0.4)
Eos: 2 %
Hematocrit: 38.4 % (ref 34.0–46.6)
Hemoglobin: 13.2 g/dL (ref 11.1–15.9)
Immature Grans (Abs): 0 10*3/uL (ref 0.0–0.1)
Immature Granulocytes: 0 %
Lymphocytes Absolute: 1.9 10*3/uL (ref 0.7–3.1)
Lymphs: 37 %
MCH: 30.1 pg (ref 26.6–33.0)
MCHC: 34.4 g/dL (ref 31.5–35.7)
MCV: 88 fL (ref 79–97)
Monocytes Absolute: 0.4 10*3/uL (ref 0.1–0.9)
Monocytes: 7 %
Neutrophils Absolute: 2.7 10*3/uL (ref 1.4–7.0)
Neutrophils: 53 %
Platelets: 299 10*3/uL (ref 150–450)
RBC: 4.39 x10E6/uL (ref 3.77–5.28)
RDW: 11.9 % (ref 11.7–15.4)
WBC: 5.1 10*3/uL (ref 3.4–10.8)

## 2022-03-08 LAB — URINE CULTURE: Culture: NO GROWTH

## 2022-04-02 ENCOUNTER — Encounter: Payer: Self-pay | Admitting: Medical-Surgical

## 2022-04-02 ENCOUNTER — Ambulatory Visit (INDEPENDENT_AMBULATORY_CARE_PROVIDER_SITE_OTHER): Payer: Medicare Other | Admitting: Medical-Surgical

## 2022-04-02 VITALS — BP 133/81 | HR 74 | Resp 20 | Ht 61.0 in | Wt 124.1 lb

## 2022-04-02 DIAGNOSIS — Z13220 Encounter for screening for lipoid disorders: Secondary | ICD-10-CM | POA: Diagnosis not present

## 2022-04-02 DIAGNOSIS — M810 Age-related osteoporosis without current pathological fracture: Secondary | ICD-10-CM

## 2022-04-02 DIAGNOSIS — Z0000 Encounter for general adult medical examination without abnormal findings: Secondary | ICD-10-CM

## 2022-04-02 NOTE — Progress Notes (Signed)
Complete physical exam  Patient: Selena Mccarthy   DOB: 01/24/1935   87 y.o. Female  MRN: :[id number]  Subjective:    Chief Complaint  Patient presents with   Annual Exam   Selena Mccarthy is a 87 y.o. female who presents today for a complete physical exam. She reports consuming a general diet.  Stays physically active on a daily basis.  She generally feels well. She reports sleeping well. She does not have additional problems to discuss today.   Most recent fall risk assessment:    04/02/2022   10:32 AM  Fall Risk   Falls in the past year? 0  Number falls in past yr: 0  Injury with Fall? 0  Risk for fall due to : No Fall Risks  Follow up Falls evaluation completed     Most recent depression screenings:    04/02/2022   10:32 AM 02/26/2022    9:52 AM  PHQ 2/9 Scores  PHQ - 2 Score 0 0    Vision:Within last year and Dental: No current dental problems and Receives regular dental care    Patient Care Team: Samuel Bouche, NP as PCP - General (Nurse Practitioner)   Outpatient Medications Prior to Visit  Medication Sig   aspirin 81 MG tablet Take 81 mg by mouth daily.   ciprofloxacin (CIPRO) 500 MG tablet Take 1 tablet (500 mg total) by mouth every 12 (twelve) hours.   hydrochlorothiazide (HYDRODIURIL) 25 MG tablet Take 1 tablet (25 mg total) by mouth daily.   lisinopril (ZESTRIL) 30 MG tablet TAKE 1 TABLET BY MOUTH EVERY DAY   Multiple Vitamin (MULTIVITAMIN) capsule Take 1 capsule by mouth daily.   No facility-administered medications prior to visit.    Review of Systems  Constitutional:  Negative for chills, fever, malaise/fatigue and weight loss.  HENT:  Negative for congestion, ear pain, hearing loss, sinus pain and sore throat.   Eyes:  Negative for blurred vision, photophobia and pain.  Respiratory:  Negative for cough, shortness of breath and wheezing.   Cardiovascular:  Negative for chest pain, palpitations and leg swelling.  Gastrointestinal:  Negative for abdominal  pain, constipation, diarrhea, heartburn, nausea and vomiting.  Genitourinary:  Negative for dysuria, frequency and urgency.  Musculoskeletal:  Negative for falls and neck pain.  Skin:  Negative for itching and rash.  Neurological:  Negative for dizziness, weakness and headaches.  Endo/Heme/Allergies:  Negative for polydipsia. Does not bruise/bleed easily.  Psychiatric/Behavioral:  Negative for depression, substance abuse and suicidal ideas. The patient is not nervous/anxious.       Objective:    BP 133/81 (BP Location: Right Arm, Cuff Size: Normal)   Pulse 74   Resp 20   Ht 5\' 1"  (1.549 m)   Wt 124 lb 1.9 oz (56.3 kg)   SpO2 100%   BMI 23.45 kg/m    Physical Exam Constitutional:      General: She is not in acute distress.    Appearance: Normal appearance. She is normal weight. She is not ill-appearing.  HENT:     Head: Normocephalic and atraumatic.     Right Ear: Tympanic membrane, ear canal and external ear normal. There is no impacted cerumen.     Left Ear: Tympanic membrane, ear canal and external ear normal. There is no impacted cerumen.     Nose: Nose normal. No congestion or rhinorrhea.     Mouth/Throat:     Mouth: Mucous membranes are moist.     Pharynx: No  oropharyngeal exudate or posterior oropharyngeal erythema.  Eyes:     General: No scleral icterus.       Right eye: No discharge.        Left eye: No discharge.     Extraocular Movements: Extraocular movements intact.     Conjunctiva/sclera: Conjunctivae normal.     Pupils: Pupils are equal, round, and reactive to light.  Neck:     Thyroid: No thyromegaly.     Vascular: No carotid bruit or JVD.     Trachea: Trachea normal.  Cardiovascular:     Rate and Rhythm: Normal rate and regular rhythm.     Pulses: Normal pulses.     Heart sounds: Normal heart sounds. No murmur heard.    No friction rub. No gallop.  Pulmonary:     Effort: Pulmonary effort is normal. No respiratory distress.     Breath sounds: Normal  breath sounds. No wheezing.  Abdominal:     General: Bowel sounds are normal. There is no distension.     Palpations: Abdomen is soft.     Tenderness: There is no abdominal tenderness. There is no guarding.  Musculoskeletal:        General: Normal range of motion.     Cervical back: Normal range of motion and neck supple.  Lymphadenopathy:     Cervical: No cervical adenopathy.  Skin:    General: Skin is warm and dry.  Neurological:     Mental Status: She is alert and oriented to person, place, and time.     Cranial Nerves: No cranial nerve deficit.  Psychiatric:        Mood and Affect: Mood normal.        Behavior: Behavior normal.        Thought Content: Thought content normal.        Judgment: Judgment normal.    No results found for any visits on 04/02/22.     Assessment & Plan:    Routine Health Maintenance and Physical Exam  Immunization History  Administered Date(s) Administered   PFIZER(Purple Top)SARS-COV-2 Vaccination 05/30/2019, 06/27/2019   Pneumococcal Conjugate-13 09/14/2013   Pneumococcal Polysaccharide-23 05/04/2017    Health Maintenance  Topic Date Due   DTaP/Tdap/Td (1 - Tdap) Never done   COVID-19 Vaccine (3 - 2023-24 season) 04/18/2022 (Originally 09/11/2021)   Zoster Vaccines- Shingrix (1 of 2) 05/27/2022 (Originally 11/18/1987)   DEXA SCAN  02/27/2023 (Originally 10/17/2020)   Medicare Annual Wellness (AWV)  02/27/2023   Pneumonia Vaccine 54+ Years old  Completed   HPV VACCINES  Aged Out    Discussed health benefits of physical activity, and encouraged her to engage in regular exercise appropriate for her age and condition.  1. Annual physical exam Deferring labs today, recent labs looked good. UTD on preventative care. Wellness information provided with AVS.   2. Lipid screening Deferred today.   3. Age-related osteoporosis without current pathological fracture DEXA scan ordered.  - DG Bone Density; Future  Return in about 6 months (around  10/03/2022) for chronic disease follow up.   Samuel Bouche, NP

## 2022-04-14 ENCOUNTER — Ambulatory Visit (INDEPENDENT_AMBULATORY_CARE_PROVIDER_SITE_OTHER): Payer: Medicare Other

## 2022-04-14 DIAGNOSIS — M810 Age-related osteoporosis without current pathological fracture: Secondary | ICD-10-CM

## 2022-04-28 ENCOUNTER — Other Ambulatory Visit: Payer: Self-pay | Admitting: Medical-Surgical

## 2022-04-28 DIAGNOSIS — I10 Essential (primary) hypertension: Secondary | ICD-10-CM

## 2022-09-24 ENCOUNTER — Other Ambulatory Visit: Payer: Self-pay

## 2022-09-24 ENCOUNTER — Ambulatory Visit
Admission: EM | Admit: 2022-09-24 | Discharge: 2022-09-24 | Disposition: A | Discharge status: Home / Self Care | Payer: Medicare Other | Attending: Physician Assistant | Admitting: Physician Assistant

## 2022-09-24 DIAGNOSIS — H1031 Unspecified acute conjunctivitis, right eye: Secondary | ICD-10-CM | POA: Diagnosis not present

## 2022-09-24 MED ORDER — TOBRAMYCIN 0.3 % OP SOLN: 1.0000 [drp] | OPHTHALMIC | 0 refills | Status: DC

## 2022-09-24 NOTE — ED Provider Notes (Signed)
[facility]    CSN: [id number] Arrival date & time: 09/24/22  1032      History   Chief Complaint Chief Complaint  Patient presents with   Eye Pain    HPI DEARIA TRUMBAUER is a 87 y.o. female.   Pt complains of right eye irritation.  Pt reports drainage and crusting this am.  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  Eye Pain This is a new problem. The current episode started less than 1 hour ago. Nothing aggravates the symptoms.    Past Medical History:  Diagnosis Date   Colon cancer (HCC)    DVT, lower extremity (HCC) 1978   Left   Hyperlipidemia    Hypertension     Patient Active Problem List   Diagnosis Date Noted   History of deep vein thrombosis (DVT) of lower extremity 07/11/2018   Microscopic hematuria 07/11/2018   Diuretic-induced hypokalemia 08/17/2017   Medial epicondylitis of elbow, right 05/04/2017   Uncontrolled stage 2 hypertension 12/22/2016   History of colon cancer 12/22/2016   Family history of stroke or transient ischemic attack in father 12/22/2016   Hyperlipidemia 10/02/2013   Essential hypertension 09/14/2013    Past Surgical History:  Procedure Laterality Date   ABDOMINAL HYSTERECTOMY     APPENDECTOMY     COLON SURGERY      OB History   No obstetric history on file.      Home Medications    Prior to Admission medications   Medication Sig Start Date End Date Taking? Authorizing Provider  tobramycin (TOBREX) 0.3 % ophthalmic solution Place 1 drop into the right eye every 4 (four) hours for 10 days. 09/24/22 10/04/22 Yes Elson Areas, PA-C  aspirin 81 MG tablet Take 81 mg by mouth daily.    [provider]  ciprofloxacin (CIPRO) 500 MG tablet Take 1 tablet (500 mg total) by mouth every 12 (twelve) hours. 03/05/22   Wallis Bamberg, PA-C  hydrochlorothiazide (HYDRODIURIL) 25 MG tablet Take 1 tablet (25 mg total) by mouth daily. 10/05/21   Christen Butter, NP  lisinopril  (ZESTRIL) 30 MG tablet TAKE 1 TABLET BY MOUTH EVERY DAY 04/28/22   Christen Butter, NP  Multiple Vitamin (MULTIVITAMIN) capsule Take 1 capsule by mouth daily.    [provider]    Family History Family History  Problem Relation Age of Onset   Uterine cancer Mother    Stroke Father     Social History Social History   Tobacco Use   Smoking status: Never   Smokeless tobacco: Never  Vaping Use   Vaping status: Never Used  Substance Use Topics   Alcohol use: Yes    Alcohol/week: 1.0 standard drink of alcohol    Types: 1 Shots of liquor per week    Comment: 1 teaspoon of liquor in tea every morning   Drug use: No     Allergies   Influenza vaccines   Review of Systems Review of Systems  Eyes:  Positive for pain, discharge and redness.     Physical Exam Triage Vital Signs ED Triage Vitals  Encounter Vitals Group     BP 09/24/22 1043 117/78     Systolic BP Percentile --      Diastolic BP Percentile --      Pulse Rate 09/24/22 1043 86     Resp 09/24/22 1043 16     Temp 09/24/22 1043 98.3 F (36.8 C)  Temp src --      SpO2 09/24/22 1043 99 %     Weight --      Height --      Head Circumference --      Peak Flow --      Pain Score 09/24/22 1044 5     Pain Loc --      Pain Education --      Exclude from Growth Chart --    No data found.  Updated Vital Signs BP 117/78 (BP Location: Right Arm)   Pulse 86   Temp 98.3 F (36.8 C)   Resp 16   SpO2 99%   Visual Acuity Right Eye Distance: 20/50 Left Eye Distance: 20/50 Bilateral Distance: 20/50  Right Eye Near:   Left Eye Near:    Bilateral Near:     Physical Exam Vitals reviewed.  Constitutional:      Appearance: Normal appearance.  Eyes:     General:        Right eye: Discharge present.     Extraocular Movements: Extraocular movements intact.     Pupils: Pupils are equal, round, and reactive to light.  Cardiovascular:     Rate and Rhythm: Normal rate and regular rhythm.  Skin:     General: Skin is warm.  Neurological:     General: No focal deficit present.     Mental Status: She is alert.      UC Treatments / Results  Labs (all labs ordered are listed, but only abnormal results are displayed) Labs Reviewed - No data to display  EKG   Radiology No results found.  Procedures Procedures (including critical care time)  Medications Ordered in UC Medications - No data to display  Initial Impression / Assessment and Plan / UC Course  I have reviewed the triage vital signs and the nursing notes.  Pertinent labs & imaging results that were available during my care of the patient were reviewed by me and considered in my medical decision making (see chart for details).     Pt counseled on eye infections.  Pt advised cool compresses.  Rx for tobrex Final Clinical Impressions(s) / UC Diagnoses   Final diagnoses:  Acute bacterial conjunctivitis of right eye   Discharge Instructions   None    ED Prescriptions     Medication Sig Dispense Auth. Provider   tobramycin (TOBREX) 0.3 % ophthalmic solution Place 1 drop into the right eye every 4 (four) hours for 10 days. 5 mL Elson Areas, [place]      PDMP not reviewed this encounter. An After Visit Summary was printed and given to the patient.       Elson Areas, [place] 09/24/22 1155

## 2022-09-24 NOTE — ED Triage Notes (Signed)
Right eye swelling and pain x 3 days

## 2022-10-04 ENCOUNTER — Ambulatory Visit (INDEPENDENT_AMBULATORY_CARE_PROVIDER_SITE_OTHER): Payer: Medicare Other | Admitting: Medical-Surgical

## 2022-10-04 ENCOUNTER — Encounter: Payer: Self-pay | Admitting: Medical-Surgical

## 2022-10-04 VITALS — BP 119/77 | HR 72 | Ht 61.0 in | Wt 121.9 lb

## 2022-10-04 DIAGNOSIS — I10 Essential (primary) hypertension: Secondary | ICD-10-CM

## 2022-10-04 DIAGNOSIS — E782 Mixed hyperlipidemia: Secondary | ICD-10-CM | POA: Diagnosis not present

## 2022-10-04 MED ORDER — LISINOPRIL 30 MG PO TABS: 30.0000 mg | ORAL_TABLET | Freq: Every day | ORAL | 3 refills | Status: DC

## 2022-10-04 MED ORDER — HYDROCHLOROTHIAZIDE 25 MG PO TABS: 25.0000 mg | ORAL_TABLET | Freq: Every day | ORAL | 3 refills | Status: DC

## 2022-10-04 NOTE — Progress Notes (Signed)
        Established patient visit  History, exam, impression, and plan:  1. Essential hypertension Very pleasant 87 year old female presenting with a history of HTN currently well managed with hydrochlorothiazide 5mg  daily and lisinopril 30 milligrams daily not regularly checking blood pressures at home however she does have a cuff.  Following a low-sodium heart healthy diet.  Stays very active on a daily basis.  Denies concerning symptoms today.  Normal cardiopulmonary exam.  Checking labs as below.  Blood pressure is well-controlled today.  Continue HCTZ and lisinopril as prescribed. - Comprehensive metabolic panel - Lipid panel - hydrochlorothiazide (HYDRODIURIL) 25 MG tablet; Take 1 tablet (25 mg total) by mouth daily.  Dispense: 90 tablet; Refill: 3 - lisinopril (ZESTRIL) 30 MG tablet; Take 1 tablet (30 mg total) by mouth daily.  Dispense: 90 tablet; Refill: 3  2. Mixed hyperlipidemia History of mixed hyperlipidemia not currently on any medications for management.  Checking lipid panel today. - Lipid panel   Procedures performed this visit: None.  Return in about 6 months (around 04/03/2023) for HTN follow up.  __________________________________ Thayer Ohm, DNP, APRN, FNP-BC Primary Care and Sports Medicine [facility] [place]

## 2022-10-05 LAB — COMPREHENSIVE METABOLIC PANEL
ALT: 9 IU/L (ref 0–32)
AST: 19 IU/L (ref 0–40)
Albumin: 4.8 g/dL — ABNORMAL HIGH (ref 3.7–4.7)
Alkaline Phosphatase: 66 IU/L (ref 44–121)
BUN/Creatinine Ratio: 21 (ref 12–28)
BUN: 18 mg/dL (ref 8–27)
Bilirubin Total: 0.4 mg/dL (ref 0.0–1.2)
CO2: 27 mmol/L (ref 20–29)
Calcium: 10.6 mg/dL — ABNORMAL HIGH (ref 8.7–10.3)
Chloride: 99 mmol/L (ref 96–106)
Creatinine, Ser: 0.87 mg/dL (ref 0.57–1.00)
Globulin, Total: 2.8 g/dL (ref 1.5–4.5)
Glucose: 68 mg/dL — ABNORMAL LOW (ref 70–99)
Potassium: 3.4 mmol/L — ABNORMAL LOW (ref 3.5–5.2)
Sodium: 145 mmol/L — ABNORMAL HIGH (ref 134–144)
Total Protein: 7.6 g/dL (ref 6.0–8.5)
eGFR: 66 mL/min/{1.73_m2} (ref >59)

## 2022-10-05 LAB — LIPID PANEL
Chol/HDL Ratio: 3.4 ratio (ref 0.0–4.4)
Cholesterol, Total: 257 mg/dL — ABNORMAL HIGH (ref 100–199)
HDL: 76 mg/dL (ref >39)
LDL Chol Calc (NIH): 166 mg/dL — ABNORMAL HIGH (ref 0–99)
Triglycerides: 91 mg/dL (ref 0–149)
VLDL Cholesterol Cal: 15 mg/dL (ref 5–40)

## 2023-03-16 ENCOUNTER — Ambulatory Visit: Payer: Medicare Other

## 2023-03-16 VITALS — BP 132/77 | HR 79 | Ht 62.5 in | Wt 124.0 lb

## 2023-03-16 DIAGNOSIS — Z0000 Encounter for general adult medical examination without abnormal findings: Secondary | ICD-10-CM

## 2023-03-16 NOTE — Patient Instructions (Signed)
  Ms. Siguenza , Thank you for taking time to come for your Medicare Wellness Visit. I appreciate your ongoing commitment to your health goals. Please review the following plan we discussed and let me know if I can assist you in the future.   These are the goals we discussed:  Goals       Activity and Exercise Increased      She would like to active and healthy.      Blood Pressure < 140/90      DIET - REDUCE SALT INTAKE TO 2 GRAMS PER DAY OR LESS      Patient Stated      Stay healthy and active.      Patient Stated (pt-stated)      Stay healthy and active.      Patient Stated (pt-stated)      Patient stated that she would like to maintain her healthy lifestyle.        This is a list of the screening recommended for you and due dates:  Health Maintenance  Topic Date Due   Zoster (Shingles) Vaccine (1 of 2) Never done   COVID-19 Vaccine (3 - 2024-25 season) 09/12/2022   Medicare Annual Wellness Visit  03/15/2024   DEXA scan (bone density measurement)  04/13/2024   Pneumonia Vaccine  Completed   HPV Vaccine  Aged Out   DTaP/Tdap/Td vaccine  Discontinued

## 2023-03-16 NOTE — Progress Notes (Signed)
 Subjective:   Selena Mccarthy is a 88 y.o. female who presents for Medicare Annual (Subsequent) preventive examination.  Visit Complete: In person  Patient Medicare AWV questionnaire was completed by the patient on n/a; I have confirmed that all information answered by patient is correct and no changes since this date.  Cardiac Risk Factors include: advanced age (>21men, >6 women);hypertension;dyslipidemia     Objective:    Today's Vitals   03/16/23 1103  BP: 132/77  Pulse: 79  Weight: 124 lb (56.2 kg)  Height: 5' 2.5" (1.588 m)   Body mass index is 22.32 kg/m.     03/16/2023   11:19 AM 02/26/2022    9:51 AM 02/18/2021   11:02 AM 11/30/2019   10:38 AM 10/09/2018   11:10 AM  Advanced Directives  Does Patient Have a Medical Advance Directive? No No No No No  Would patient like information on creating a medical advance directive? No - Patient declined No - Patient declined No - Patient declined Yes (MAU/Ambulatory/Procedural Areas - Information given) No - Patient declined    Current Medications (verified) Outpatient Encounter Medications as of 03/16/2023  Medication Sig   aspirin 81 MG tablet Take 81 mg by mouth daily.   hydrochlorothiazide (HYDRODIURIL) 25 MG tablet Take 1 tablet (25 mg total) by mouth daily.   lisinopril (ZESTRIL) 30 MG tablet Take 1 tablet (30 mg total) by mouth daily.   Multiple Vitamin (MULTIVITAMIN) capsule Take 1 capsule by mouth daily.   No facility-administered encounter medications on file as of 03/16/2023.    Allergies (verified) Influenza vaccines   History: Past Medical History:  Diagnosis Date   Colon cancer (HCC)    DVT, lower extremity (HCC) 1978   Left   Hyperlipidemia    Hypertension    Past Surgical History:  Procedure Laterality Date   ABDOMINAL HYSTERECTOMY     APPENDECTOMY     COLON SURGERY     Family History  Problem Relation Age of Onset   Uterine cancer Mother    Stroke Father    Social History   Socioeconomic  History   Marital status: Single    Spouse name: Not on file   Number of children: 4   Years of education: 12   Highest education level: 12th grade  Occupational History   Occupation: Programmer, applications    Comment: retired  Tobacco Use   Smoking status: Never   Smokeless tobacco: Never  Vaping Use   Vaping status: Never Used  Substance and Sexual Activity   Alcohol use: Yes    Alcohol/week: 1.0 standard drink of alcohol    Types: 1 Shots of liquor per week    Comment: 1 teaspoon of liquor in tea every morning   Drug use: No   Sexual activity: Not Currently    Birth control/protection: Surgical    Comment: Hysterectomy  Other Topics Concern   Not on file  Social History Narrative   Lives with her daughter and granddaughter. She has four children. She enjoys going to the casino.   Social Drivers of Corporate investment banker Strain: Low Risk  (03/16/2023)   Overall Financial Resource Strain (CARDIA)    Difficulty of Paying Living Expenses: Not hard at all  Food Insecurity: No Food Insecurity (03/16/2023)   Hunger Vital Sign    Worried About Running Out of Food in the Last Year: Never true    Ran Out of Food in the Last Year: Never true  Transportation Needs: No  Transportation Needs (03/16/2023)   PRAPARE - Administrator, Civil Service (Medical): No    Lack of Transportation (Non-Medical): No  Physical Activity: Sufficiently Active (03/16/2023)   Exercise Vital Sign    Days of Exercise per Week: 7 days    Minutes of Exercise per Session: 40 min  Stress: No Stress Concern Present (03/16/2023)   [employer] of Occupational Health - Occupational Stress Questionnaire    Feeling of Stress : Not at all  Social Connections: Moderately Integrated (03/16/2023)   Social Connection and Isolation Panel [NHANES]    Frequency of Communication with Friends and Family: More than three times a week    Frequency of Social Gatherings with Friends and Family: Not on file    Attends  Religious Services: More than 4 times per year    Active Member of [employer] or Organizations: Yes    Attends Banker Meetings: More than 4 times per year    Marital Status: Widowed    Tobacco Counseling Counseling given: Not Answered   Clinical Intake:  Pre-visit preparation completed: Yes  Pain : No/denies pain     BMI - recorded: 22.32 Nutritional Status: BMI of 19-24  Normal Nutritional Risks: None Diabetes: No  How often do you need to have someone help you when you read instructions, pamphlets, or other written materials from your doctor or pharmacy?: 1 - Never What is the last grade level you completed in school?: 12  Interpreter Needed?: No      Activities of Daily Living    03/16/2023   11:06 AM  In your present state of health, do you have any difficulty performing the following activities:  Hearing? 0  Vision? 0  Difficulty concentrating or making decisions? 0  Walking or climbing stairs? 0  Dressing or bathing? 0  Doing errands, shopping? 0  Preparing Food and eating ? N  Using the Toilet? N  In the past six months, have you accidently leaked urine? N  Do you have problems with loss of bowel control? N  Managing your Medications? N  Managing your Finances? N  Housekeeping or managing your Housekeeping? N    Patient Care Team: Christen Butter, NP as PCP - General (Nurse Practitioner)  Indicate any recent Medical Services you may have received from other than [facility] providers in the past year (date may be approximate).     Assessment:   This is a routine wellness examination for Selena Mccarthy.  Hearing/Vision screen No results found.   Goals Addressed             This Visit's Progress    Activity and Exercise Increased       She would like to active and healthy.      Depression Screen    03/16/2023   11:17 AM 04/02/2022   10:32 AM 02/26/2022    9:52 AM 10/02/2021   10:34 AM 03/27/2021   10:08 AM 02/18/2021   11:03 AM 11/30/2019   10:40  AM  PHQ 2/9 Scores  PHQ - 2 Score 0 0 0 0 0 0 0    Fall Risk    03/16/2023   11:21 AM 04/02/2022   10:32 AM 02/26/2022    9:52 AM 03/27/2021   10:08 AM 02/18/2021   11:03 AM  Fall Risk   Falls in the past year? 0 0 0 0 0  Number falls in past yr: 0 0 0 0 0  Injury with Fall? 0 0 0 0 0  Risk for fall due to : No Fall Risks No Fall Risks No Fall Risks No Fall Risks No Fall Risks  Follow up Falls evaluation completed Falls evaluation completed Falls evaluation completed Falls prevention discussed;Falls evaluation completed Falls evaluation completed;Education provided    MEDICARE RISK AT HOME: Medicare Risk at Home Any stairs in or around the home?: Yes If so, are there any without handrails?: Yes Home free of loose throw rugs in walkways, pet beds, electrical cords, etc?: Yes Adequate lighting in your home to reduce risk of falls?: Yes Life alert?: No Use of a cane, walker or w/c?: No Grab bars in the bathroom?: No Shower chair or bench in shower?: No Elevated toilet seat or a handicapped toilet?: No  TIMED UP AND GO:  Was the test performed?  Yes  Length of time to ambulate 10 feet: 9 sec Gait steady and fast without use of assistive device    Cognitive Function:        03/16/2023   11:22 AM 02/26/2022   10:02 AM 02/18/2021   11:11 AM 11/30/2019   10:41 AM 10/09/2018   11:14 AM  6CIT Screen  What Year? 0 points 0 points 0 points 0 points 0 points  What month? 0 points 0 points 0 points 0 points 0 points  What time? 0 points 0 points 0 points 0 points 0 points  Count back from 20 0 points 0 points 0 points 0 points 0 points  Months in reverse 0 points 0 points 0 points 0 points 0 points  Repeat phrase 0 points 0 points 0 points 0 points 0 points  Total Score 0 points 0 points 0 points 0 points 0 points    Immunizations Immunization History  Administered Date(s) Administered   PFIZER(Purple Top)SARS-COV-2 Vaccination 05/30/2019, 06/27/2019   Pneumococcal Conjugate-13  09/14/2013   Pneumococcal Polysaccharide-23 05/04/2017    TDAP status: Due, Education has been provided regarding the importance of this vaccine. Advised may receive this vaccine at local pharmacy or Health Dept. Aware to provide a copy of the vaccination record if obtained from local pharmacy or Health Dept. Verbalized acceptance and understanding.  Flu Vaccine status: Declined, Education has been provided regarding the importance of this vaccine but patient still declined. Advised may receive this vaccine at local pharmacy or Health Dept. Aware to provide a copy of the vaccination record if obtained from local pharmacy or Health Dept. Verbalized acceptance and understanding. Patient is allergic  Pneumococcal vaccine status: Up to date  Covid-19 vaccine status: Completed vaccines  Qualifies for Shingles Vaccine? Yes   Zostavax completed No   Shingrix Completed?: No.    Education has been provided regarding the importance of this vaccine. Patient has been advised to call insurance company to determine out of pocket expense if they have not yet received this vaccine. Advised may also receive vaccine at local pharmacy or Health Dept. Verbalized acceptance and understanding.  Screening Tests Health Maintenance  Topic Date Due   Zoster Vaccines- Shingrix (1 of 2) Never done   COVID-19 Vaccine (3 - 2024-25 season) 09/12/2022   Medicare Annual Wellness (AWV)  03/15/2024   DEXA SCAN  04/13/2024   Pneumonia Vaccine 110+ Years old  Completed   HPV VACCINES  Aged Out   DTaP/Tdap/Td  Discontinued    Health Maintenance  Health Maintenance Due  Topic Date Due   Zoster Vaccines- Shingrix (1 of 2) Never done   COVID-19 Vaccine (3 - 2024-25 season) 09/12/2022    Colorectal cancer  screening: No longer required.   Mammogram status: No longer required due to age.  Bone Density status: Completed 04/14/2022. Results reflect: Bone density results: OSTEOPOROSIS. Repeat every 2 years.  Lung Cancer  Screening: (Low Dose CT Chest recommended if Age 67-80 years, 20 pack-year currently smoking OR have quit w/in 15years.) does not qualify.   Lung Cancer Screening Referral: n/a  Additional Screening:  Hepatitis C Screening: does not qualify; Completed n/a  Vision Screening: Recommended annual ophthalmology exams for early detection of glaucoma and other disorders of the eye. Is the patient up to date with their annual eye exam?  Yes  Who is the provider or what is the name of the office in which the patient attends annual eye exams? [facility] If pt is not established with a provider, would they like to be referred to a provider to establish care? No .   Dental Screening: Recommended annual dental exams for proper oral hygiene   Community Resource Referral / Chronic Care Management: CRR required this visit?  No   CCM required this visit?  No     Plan:     I have personally reviewed and noted the following in the patient's chart:   Medical and social history Use of alcohol, tobacco or illicit drugs  Current medications and supplements including opioid prescriptions. Patient is not currently taking opioid prescriptions. Functional ability and status Nutritional status Physical activity Advanced directives List of other physicians Hospitalizations, surgeries, and ER visits in previous 12 months Vitals Screenings to include cognitive, depression, and falls Referrals and appointments  In addition, I have reviewed and discussed with patient certain preventive protocols, quality metrics, and best practice recommendations. A written personalized care plan for preventive services as well as general preventive health recommendations were provided to patient.     Esmond Harps, CMA   03/16/2023   After Visit Summary: (In Person-Printed) AVS printed and given to the patient  Nurse Notes:   Selena Mccarthy is a 88 y.o. female patient of Christen Butter, NP who had a Medicare  Annual Wellness Visit today. Selena Mccarthy is Retired and lives with their daughter. she has 4 children. She reports that she is socially active and does interact with friends/family regularly. She is minimally physically active and enjoys going to the casino.

## 2023-04-04 ENCOUNTER — Encounter: Payer: Self-pay | Admitting: Medical-Surgical

## 2023-04-04 ENCOUNTER — Ambulatory Visit (INDEPENDENT_AMBULATORY_CARE_PROVIDER_SITE_OTHER): Payer: Medicare Other | Admitting: Medical-Surgical

## 2023-04-04 VITALS — BP 127/77 | HR 71 | Ht 62.5 in | Wt 124.1 lb

## 2023-04-04 DIAGNOSIS — E782 Mixed hyperlipidemia: Secondary | ICD-10-CM | POA: Diagnosis not present

## 2023-04-04 DIAGNOSIS — I10 Essential (primary) hypertension: Secondary | ICD-10-CM | POA: Diagnosis not present

## 2023-04-04 NOTE — Progress Notes (Signed)
        Established patient visit  History, exam, impression, and plan:  1. Essential hypertension (Primary) Pleasant 88 year old female presenting today with a hx of HTN that is well managed with Lisinopril 30mg  daily and hydrochlorothiazide 25mg  daily. Tolerating both medications well without side effects. Not regularly checking BP but does occasionally. Reports readings have been good. Denies CP, SOB, palpitations, lower extremity edema, dizziness, headaches, or vision changes. Cardiopulmonary exam normal. BP at goal. Checking labs. No change in medications.  - CBC with Differential/Platelet - CMP14+EGFR - Lipid panel  2. Mixed hyperlipidemia Not currently medicated for elevated cholesterol. Historically, resistant to statin medications. Rechecking lipids today.  - CMP14+EGFR - Lipid panel  Procedures performed this visit: None.  Return in about 6 months (around 10/05/2023) for HLD/HTN follow up.  __________________________________ Thayer Ohm, DNP, APRN, FNP-BC Primary Care and Sports Medicine [facility] [place]

## 2023-04-05 LAB — CMP14+EGFR
ALT: 10 IU/L (ref 0–32)
AST: 20 IU/L (ref 0–40)
Albumin: 4.4 g/dL (ref 3.7–4.7)
Alkaline Phosphatase: 63 IU/L (ref 44–121)
BUN/Creatinine Ratio: 14 (ref 12–28)
BUN: 11 mg/dL (ref 8–27)
Bilirubin Total: 0.6 mg/dL (ref 0.0–1.2)
CO2: 26 mmol/L (ref 20–29)
Calcium: 10 mg/dL (ref 8.7–10.3)
Chloride: 102 mmol/L (ref 96–106)
Creatinine, Ser: 0.76 mg/dL (ref 0.57–1.00)
Globulin, Total: 2.8 g/dL (ref 1.5–4.5)
Glucose: 86 mg/dL (ref 70–99)
Potassium: 3.6 mmol/L (ref 3.5–5.2)
Sodium: 142 mmol/L (ref 134–144)
Total Protein: 7.2 g/dL (ref 6.0–8.5)
eGFR: 77 mL/min/{1.73_m2} (ref >59)

## 2023-04-05 LAB — CBC WITH DIFFERENTIAL/PLATELET
Basophils Absolute: 0 10*3/uL (ref 0.0–0.2)
Basos: 1 %
EOS (ABSOLUTE): 0.1 10*3/uL (ref 0.0–0.4)
Eos: 2 %
Hematocrit: 37.3 % (ref 34.0–46.6)
Hemoglobin: 12.4 g/dL (ref 11.1–15.9)
Immature Grans (Abs): 0 10*3/uL (ref 0.0–0.1)
Immature Granulocytes: 0 %
Lymphocytes Absolute: 1.4 10*3/uL (ref 0.7–3.1)
Lymphs: 43 %
MCH: 30.2 pg (ref 26.6–33.0)
MCHC: 33.2 g/dL (ref 31.5–35.7)
MCV: 91 fL (ref 79–97)
Monocytes Absolute: 0.3 10*3/uL (ref 0.1–0.9)
Monocytes: 10 %
Neutrophils Absolute: 1.4 10*3/uL (ref 1.4–7.0)
Neutrophils: 44 %
Platelets: 242 10*3/uL (ref 150–450)
RBC: 4.11 x10E6/uL (ref 3.77–5.28)
RDW: 12.7 % (ref 11.7–15.4)
WBC: 3.3 10*3/uL — ABNORMAL LOW (ref 3.4–10.8)

## 2023-04-05 LAB — LIPID PANEL
Chol/HDL Ratio: 3.3 ratio (ref 0.0–4.4)
Cholesterol, Total: 241 mg/dL — ABNORMAL HIGH (ref 100–199)
HDL: 72 mg/dL (ref >39)
LDL Chol Calc (NIH): 158 mg/dL — ABNORMAL HIGH (ref 0–99)
Triglycerides: 64 mg/dL (ref 0–149)
VLDL Cholesterol Cal: 11 mg/dL (ref 5–40)

## 2023-09-09 ENCOUNTER — Other Ambulatory Visit: Payer: Self-pay | Admitting: Medical-Surgical

## 2023-09-09 DIAGNOSIS — I10 Essential (primary) hypertension: Secondary | ICD-10-CM

## 2023-10-05 ENCOUNTER — Encounter: Payer: Self-pay | Admitting: Medical-Surgical

## 2023-10-05 ENCOUNTER — Ambulatory Visit (INDEPENDENT_AMBULATORY_CARE_PROVIDER_SITE_OTHER): Admitting: Medical-Surgical

## 2023-10-05 VITALS — BP 128/80 | HR 71 | Resp 20 | Ht 62.5 in | Wt 120.0 lb

## 2023-10-05 DIAGNOSIS — I10 Essential (primary) hypertension: Secondary | ICD-10-CM

## 2023-10-05 DIAGNOSIS — E782 Mixed hyperlipidemia: Secondary | ICD-10-CM

## 2023-10-05 MED ORDER — HYDROCHLOROTHIAZIDE 25 MG PO TABS: 25.0000 mg | ORAL_TABLET | Freq: Every day | ORAL | 3 refills | Status: AC

## 2023-10-05 MED ORDER — LISINOPRIL 30 MG PO TABS: 30.0000 mg | ORAL_TABLET | Freq: Every day | ORAL | 3 refills | Status: AC

## 2023-10-05 NOTE — Progress Notes (Signed)
        Established patient visit  History, exam, impression, and plan:  1. Essential hypertension Very pleasant 88 year old female coming in today to follow-up on essential hypertension.  She is currently treated with lisinopril  30 mg daily and hydrochlorothiazide  25 mg daily.  Tolerates both medications well without side effects.  Monitors blood pressure at home and notes that her readings have been in the normal range.  She does use salt with cooking but does not add any at the table.  She wears compression socks to help with venous insufficiency and lower extremity swelling.  She stays very active at home noting that she cleaned out her garage yesterday and made sure to mop the floors.  Notes that she can tell when her blood pressure is running higher than it should because she feels a pressure on the back of her head.  Denies chest pain, shortness of breath, palpitations.  Blood pressure today at 128/80, stable and well-controlled.  Checking labs as below.  Continue lisinopril  and hydrochlorothiazide  as ordered. - hydrochlorothiazide  (HYDRODIURIL ) 25 MG tablet; Take 1 tablet (25 mg total) by mouth daily.  Dispense: 90 tablet; Refill: 3 - lisinopril  (ZESTRIL ) 30 MG tablet; Take 1 tablet (30 mg total) by mouth daily.  Dispense: 90 tablet; Refill: 3 - CBC with Differential/Platelet - CMP14+EGFR - Lipid panel  2. Mixed hyperlipidemia (Primary) History of hyperlipidemia however is not currently on a statin medication.  Follows a low-fat heart healthy diet and maintains a healthy weight.  As noted above, stays physically active.  Checking lipids today. - Lipid panel  Physical Exam Vitals reviewed.  Constitutional:      General: She is not in acute distress.    Appearance: Normal appearance. She is normal weight. She is not ill-appearing.  HENT:     Head: Normocephalic and atraumatic.  Cardiovascular:     Rate and Rhythm: Normal rate and regular rhythm.     Pulses: Normal pulses.     Heart  sounds: Normal heart sounds. No murmur heard.    No friction rub. No gallop.  Pulmonary:     Effort: Pulmonary effort is normal. No respiratory distress.     Breath sounds: Normal breath sounds. No wheezing.  Skin:    General: Skin is warm and dry.  Neurological:     Mental Status: She is alert and oriented to person, place, and time.  Psychiatric:        Mood and Affect: Mood normal.        Behavior: Behavior normal.        Thought Content: Thought content normal.        Judgment: Judgment normal.     Procedures performed this visit: None.  Return in about 6 months (around 04/03/2024) for HTN/HLD follow up.  __________________________________ Zada FREDRIK Palin, DNP, APRN, FNP-BC Primary Care and Sports Medicine [facility] [place]

## 2023-10-06 ENCOUNTER — Ambulatory Visit: Payer: Self-pay | Admitting: Medical-Surgical

## 2023-10-06 LAB — CMP14+EGFR
ALT: 8 IU/L (ref 0–32)
AST: 21 IU/L (ref 0–40)
Albumin: 4.8 g/dL — ABNORMAL HIGH (ref 3.7–4.7)
Alkaline Phosphatase: 67 IU/L (ref 48–129)
BUN/Creatinine Ratio: 20 (ref 12–28)
BUN: 18 mg/dL (ref 8–27)
Bilirubin Total: 0.7 mg/dL (ref 0.0–1.2)
CO2: 24 mmol/L (ref 20–29)
Calcium: 10.6 mg/dL — ABNORMAL HIGH (ref 8.7–10.3)
Chloride: 100 mmol/L (ref 96–106)
Creatinine, Ser: 0.92 mg/dL (ref 0.57–1.00)
Globulin, Total: 3 g/dL (ref 1.5–4.5)
Glucose: 85 mg/dL (ref 70–99)
Potassium: 3.8 mmol/L (ref 3.5–5.2)
Sodium: 142 mmol/L (ref 134–144)
Total Protein: 7.8 g/dL (ref 6.0–8.5)
eGFR: 61 mL/min/1.73 (ref >59)

## 2023-10-06 LAB — CBC WITH DIFFERENTIAL/PLATELET
Basophils Absolute: 0.1 x10E3/uL (ref 0.0–0.2)
Basos: 2 %
EOS (ABSOLUTE): 0.1 x10E3/uL (ref 0.0–0.4)
Eos: 3 %
Hematocrit: 40.3 % (ref 34.0–46.6)
Hemoglobin: 13.3 g/dL (ref 11.1–15.9)
Immature Grans (Abs): 0 x10E3/uL (ref 0.0–0.1)
Immature Granulocytes: 0 %
Lymphocytes Absolute: 1.4 x10E3/uL (ref 0.7–3.1)
Lymphs: 43 %
MCH: 30.3 pg (ref 26.6–33.0)
MCHC: 33 g/dL (ref 31.5–35.7)
MCV: 92 fL (ref 79–97)
Monocytes Absolute: 0.3 x10E3/uL (ref 0.1–0.9)
Monocytes: 9 %
Neutrophils Absolute: 1.3 x10E3/uL — ABNORMAL LOW (ref 1.4–7.0)
Neutrophils: 43 %
Platelets: 210 x10E3/uL (ref 150–450)
RBC: 4.39 x10E6/uL (ref 3.77–5.28)
RDW: 13 % (ref 11.7–15.4)
WBC: 3.1 x10E3/uL — ABNORMAL LOW (ref 3.4–10.8)

## 2023-10-06 LAB — LIPID PANEL
Chol/HDL Ratio: 3.6 ratio (ref 0.0–4.4)
Cholesterol, Total: 270 mg/dL — ABNORMAL HIGH (ref 100–199)
HDL: 75 mg/dL (ref >39)
LDL Chol Calc (NIH): 182 mg/dL — ABNORMAL HIGH (ref 0–99)
Triglycerides: 79 mg/dL (ref 0–149)
VLDL Cholesterol Cal: 13 mg/dL (ref 5–40)

## 2023-10-10 ENCOUNTER — Telehealth: Payer: Self-pay

## 2023-10-10 NOTE — Telephone Encounter (Signed)
 Copied from CRM #[id number]. Topic: Clinical - Lab/Test Results >> Oct 10, 2023 11:53 AM Dedra B wrote: Reason for CRM: Pt returning call regarding lab results.

## 2023-10-10 NOTE — Telephone Encounter (Signed)
 Spoke with patient informed of results.  She states the cholesterol is likely elevated due to eating out a lot at picnics, family reunions and funerals. She is going to make some diet changes and anticipates it will be better in the future.

## 2024-03-21 ENCOUNTER — Ambulatory Visit

## 2024-04-04 ENCOUNTER — Ambulatory Visit: Admitting: Medical-Surgical
